# Patient Record
Sex: Female | Born: 1995 | Race: White | Hispanic: No | Marital: Married | Smoking: Former smoker
Health system: Southern US, Community
[De-identification: ages and names within clinical notes are randomized; demographics above are authoritative.]

## PROBLEM LIST (undated history)

## (undated) DIAGNOSIS — O09299 Supervision of pregnancy with other poor reproductive or obstetric history, unspecified trimester: Secondary | ICD-10-CM

## (undated) DIAGNOSIS — F32A Depression, unspecified: Secondary | ICD-10-CM

## (undated) HISTORY — PX: RHINOPLASTY: SUR1284

## (undated) HISTORY — DX: Depression, unspecified: F32.A

## (undated) HISTORY — PX: PLACEMENT OF BREAST IMPLANTS: SHX6334

## (undated) HISTORY — DX: Supervision of pregnancy with other poor reproductive or obstetric history, unspecified trimester: O09.299

---

## 2016-06-30 ENCOUNTER — Encounter (HOSPITAL_COMMUNITY): Payer: Self-pay | Admitting: Emergency Medicine

## 2016-06-30 ENCOUNTER — Emergency Department (HOSPITAL_COMMUNITY)
Admission: EM | Admit: 2016-06-30 | Discharge: 2016-06-30 | Disposition: A | Discharge status: Home / Self Care | Payer: Medicaid Other | Attending: Emergency Medicine | Admitting: Emergency Medicine

## 2016-06-30 ENCOUNTER — Emergency Department (HOSPITAL_COMMUNITY): Payer: Medicaid Other

## 2016-06-30 DIAGNOSIS — Z87891 Personal history of nicotine dependence: Secondary | ICD-10-CM | POA: Diagnosis not present

## 2016-06-30 DIAGNOSIS — R0602 Shortness of breath: Secondary | ICD-10-CM | POA: Diagnosis present

## 2016-06-30 DIAGNOSIS — J181 Lobar pneumonia, unspecified organism: Secondary | ICD-10-CM | POA: Diagnosis not present

## 2016-06-30 DIAGNOSIS — J189 Pneumonia, unspecified organism: Secondary | ICD-10-CM

## 2016-06-30 MED ORDER — LEVOFLOXACIN 500 MG PO TABS: 500.0000 mg | ORAL_TABLET | Freq: Every day | ORAL | 0 refills | Status: DC

## 2016-06-30 NOTE — ED Provider Notes (Signed)
[facility] DEPT Provider Note   CSN: [id number] Arrival date & time: 06/30/16  1350     History   Chief Complaint Chief Complaint  Patient presents with  . Shortness of Breath    HPI Allison Ellis is a 20 y.o. female.  She presents for evaluation of persistent cough for 3 weeks associated with fevers, sputum production and nausea. She denies vomiting, diarrhea, dysuria, or constipation. She went to an urgent care clinic today, was sent here for evaluation of possible "blood clot". She is on oral contraceptives. No chronic respiratory illnesses. She is an ex-smoker.There are no other known modifying factors.  HPI  History reviewed. No pertinent past medical history.  There are no active problems to display for this patient.   History reviewed. No pertinent surgical history.  OB History    Gravida Para Term Preterm AB Living   1         1   SAB TAB Ectopic Multiple Live Births                   Home Medications    Prior to Admission medications   Medication Sig Start Date End Date Taking? Authorizing Provider  levofloxacin (LEVAQUIN) 500 MG tablet Take 1 tablet (500 mg total) by mouth daily. 06/30/16   Mancel BaleElliott Kalman Nylen, MD    Family History History reviewed. No pertinent family history.  Social History Social History  Substance Use Topics  . Smoking status: Former Smoker    Quit date: 04/26/2016  . Smokeless tobacco: Never Used  . Alcohol use Yes     Comment: occassional     Allergies   Amoxicillin; Bactrim [sulfamethoxazole-trimethoprim]; Clindamycin/lincomycin; and Penicillins   Review of Systems Review of Systems  All other systems reviewed and are negative.    Physical Exam Updated Vital Signs BP 101/59   Pulse 81   Temp 97.7 F (36.5 C) (Oral)   Resp 18   Ht 5\' 6"  (1.676 m)   Wt 126 lb (57.2 kg)   LMP 06/09/2016   SpO2 96%   BMI 20.34 kg/m   Physical Exam  Constitutional: She is oriented to person, place, and time. She appears  well-developed and well-nourished. No distress.  HENT:  Head: Normocephalic and atraumatic.  Eyes: Conjunctivae and EOM are normal. Pupils are equal, round, and reactive to light.  Neck: Normal range of motion and phonation normal. Neck supple.  Cardiovascular: Normal rate and regular rhythm.   Pulmonary/Chest: Effort normal. She exhibits no tenderness.  Few scattered rhonchi, left greater than right. No increased work of breathing.  Abdominal: Soft. She exhibits no distension. There is no tenderness. There is no guarding.  Musculoskeletal: Normal range of motion. She exhibits no edema, tenderness or deformity.  Neurological: She is alert and oriented to person, place, and time. She exhibits normal muscle tone.  Skin: Skin is warm and dry.  Psychiatric: She has a normal mood and affect. Her behavior is normal. Judgment and thought content normal.  Nursing note and vitals reviewed.    ED Treatments / Results  Labs (all labs ordered are listed, but only abnormal results are displayed) Labs Reviewed - No data to display  EKG  EKG Interpretation None       Radiology Dg Chest 2 View  Result Date: 06/30/2016 CLINICAL DATA:  Chest pain shortness of breath for 3 weeks. Weight loss. EXAM: CHEST  2 VIEW COMPARISON:  06/07/2016 FINDINGS: There is opacity at the left lung base common the left upper  lobe lingula and left lower lobe, mostly silhouetting the left hemidiaphragm. This is consistent with pneumonia if there are consistent clinical symptoms. Remainder of the lungs is clear. No pleural effusion. No pneumothorax. Cardiac silhouette is normal in size. Normal mediastinal and hilar contours. Skeletal structures are unremarkable. IMPRESSION: Left lower lobe and left upper lobe lingula consolidation consistent with pneumonia. Electronically Signed   By: Amie Portlandavid  Ormond M.D.   On: 06/30/2016 14:25    Procedures Procedures (including critical care time)  Medications Ordered in  ED Medications - No data to display   Initial Impression / Assessment and Plan / ED Course  I have reviewed the triage vital signs and the nursing notes.  Pertinent labs & imaging results that were available during my care of the patient were reviewed by me and considered in my medical decision making (see chart for details).  Clinical Course     Medications - No data to display  Patient Vitals for the past 24 hrs:  BP Temp Temp src Pulse Resp SpO2 Height Weight  06/30/16 1557 101/59 97.7 F (36.5 C) Oral 81 18 96 % - -  06/30/16 1400 116/68 98.7 F (37.1 C) Oral 92 18 100 % 5\' 6"  (1.676 m) 126 lb (57.2 kg)    4:38 PM Reevaluation with update and discussion. After initial assessment and treatment, an updated evaluation reveals , At discharge, she is comfortable and has no additional complaints. Findings discussed with patient and all questions were answered. Katiejo Gilroy L      Final Clinical Impressions(s) / ED Diagnoses   Final diagnoses:  Community acquired pneumonia of left lower lobe of lung (HCC)   Patient with clinical syndrome consistent with worsening respiratory infection, and radiologic imaging consistent with pneumonia. Doubt PE, ACS or impending vascular collapse.  Nursing Notes Reviewed/ Care Coordinated Applicable Imaging Reviewed Interpretation of Laboratory Data incorporated into ED treatment  The patient appears reasonably screened and/or stabilized for discharge and I doubt any other medical condition or other [facility] requiring further screening, evaluation, or treatment in the ED at this time prior to discharge.  Plan: Home Medications- continue; Home Treatments- rest; return here if the recommended treatment, does not improve the symptoms; Recommended follow up- PCP prn   New Prescriptions Discharge Medication List as of 06/30/2016  3:54 PM    START taking these medications   Details  levofloxacin (LEVAQUIN) 500 MG tablet Take 1 tablet (500 mg total)  by mouth daily., Starting Fri 06/30/2016, Print         Mancel BaleElliott Aayat Hajjar, MD 06/30/16 [phone]

## 2016-06-30 NOTE — Discharge Instructions (Signed)
Get plenty of rest and drink a lot of fluids.  Take Tylenol or Motrin for fever.  Use Robitussin DM for cough.

## 2016-06-30 NOTE — ED Triage Notes (Signed)
PT states productive cough with green sputum worsening x3 weeks. PT states she was seen at urgent care today and was told to come to ED. PT also states nausea with vomiting at times and weight loss of 7lbs in the past 3 weeks. PT states dizziness, generalized weakness and pain with inhalation.

## 2017-05-22 ENCOUNTER — Encounter (HOSPITAL_COMMUNITY): Payer: Self-pay | Admitting: Emergency Medicine

## 2017-05-22 ENCOUNTER — Emergency Department (HOSPITAL_COMMUNITY)
Admission: EM | Admit: 2017-05-22 | Discharge: 2017-05-22 | Disposition: A | Discharge status: Home / Self Care | Payer: Self-pay | Attending: Emergency Medicine | Admitting: Emergency Medicine

## 2017-05-22 ENCOUNTER — Emergency Department (HOSPITAL_COMMUNITY): Payer: Self-pay

## 2017-05-22 DIAGNOSIS — R079 Chest pain, unspecified: Secondary | ICD-10-CM | POA: Insufficient documentation

## 2017-05-22 DIAGNOSIS — E041 Nontoxic single thyroid nodule: Secondary | ICD-10-CM

## 2017-05-22 DIAGNOSIS — R109 Unspecified abdominal pain: Secondary | ICD-10-CM | POA: Insufficient documentation

## 2017-05-22 DIAGNOSIS — Y92410 Unspecified street and highway as the place of occurrence of the external cause: Secondary | ICD-10-CM | POA: Insufficient documentation

## 2017-05-22 DIAGNOSIS — Z87891 Personal history of nicotine dependence: Secondary | ICD-10-CM | POA: Insufficient documentation

## 2017-05-22 DIAGNOSIS — S161XXA Strain of muscle, fascia and tendon at neck level, initial encounter: Secondary | ICD-10-CM | POA: Insufficient documentation

## 2017-05-22 DIAGNOSIS — Y998 Other external cause status: Secondary | ICD-10-CM | POA: Insufficient documentation

## 2017-05-22 DIAGNOSIS — R51 Headache: Secondary | ICD-10-CM | POA: Insufficient documentation

## 2017-05-22 DIAGNOSIS — Y9389 Activity, other specified: Secondary | ICD-10-CM | POA: Insufficient documentation

## 2017-05-22 LAB — I-STAT BETA HCG BLOOD, ED (MC, WL, AP ONLY)

## 2017-05-22 MED ORDER — IOPAMIDOL (ISOVUE-300) INJECTION 61%
100.0000 mL | Freq: Once | INTRAVENOUS | Status: AC | PRN
  Administered 2017-05-22: 100 mL via INTRAVENOUS

## 2017-05-22 MED ORDER — IBUPROFEN 800 MG PO TABS: 800.0000 mg | ORAL_TABLET | Freq: Three times a day (TID) | ORAL | 0 refills | Status: DC
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
## 2017-05-22 NOTE — ED Notes (Signed)
Pt aware of need for urine sample and will inform nursing staff when able to provide one
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
## 2017-05-22 NOTE — Discharge Instructions (Signed)
Your xrays show a nodule on your thyroid No broken bones No abdominal or chest / ribs / lungs injury  Motrin for pain
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
## 2019-11-19 ENCOUNTER — Ambulatory Visit
Admission: EM | Admit: 2019-11-19 | Discharge: 2019-11-19 | Disposition: A | Discharge status: Home / Self Care | Payer: Self-pay

## 2019-11-19 ENCOUNTER — Other Ambulatory Visit: Payer: Self-pay

## 2019-11-19 ENCOUNTER — Encounter: Payer: Self-pay | Admitting: Emergency Medicine

## 2019-11-19 DIAGNOSIS — Z20822 Contact with and (suspected) exposure to covid-19: Secondary | ICD-10-CM

## 2019-11-19 DIAGNOSIS — J069 Acute upper respiratory infection, unspecified: Secondary | ICD-10-CM

## 2019-11-19 MED ORDER — BENZONATATE 100 MG PO CAPS: 100.0000 mg | ORAL_CAPSULE | Freq: Three times a day (TID) | ORAL | 0 refills | Status: DC

## 2019-11-19 MED ORDER — FLUTICASONE PROPIONATE 50 MCG/ACT NA SUSP: 2.0000 | Freq: Every day | NASAL | 0 refills | Status: DC

## 2019-11-19 MED ORDER — CETIRIZINE HCL 10 MG PO TABS: 10.0000 mg | ORAL_TABLET | Freq: Every day | ORAL | 0 refills | Status: DC

## 2019-11-19 NOTE — ED Triage Notes (Signed)
Sore/itchy throat for 2 days, cough today, and has a stuffy head.

## 2019-11-19 NOTE — ED Provider Notes (Signed)
[facility]   [id number] 11/19/19 Arrival Time: 1311   CC: COVID symptoms  SUBJECTIVE: History from: patient.  Allison Ellis is a 24 y.o. female who presents with nasal congestion, sore throat and cough x 2 days.  Sister with similar symptoms.  Tested negative for COVID.  Has NOT tried OTC medications.  Symptoms are made worse at night.  Denies previous symptoms in the past related to allergies.  Denies hx of COVID infection.   Denies fever, chills, SOB, wheezing, chest pain, nausea, vomiting, changes in bowel or bladder habits.    ROS: As per HPI.  All other pertinent ROS negative.     History reviewed. No pertinent past medical history. Past Surgical History:  Procedure Laterality Date  . RHINOPLASTY     Allergies  Allergen Reactions  . Amoxicillin   . Bactrim [Sulfamethoxazole-Trimethoprim]     Hives   . Clindamycin/Lincomycin     Hives  . Penicillins     Has patient had a PCN reaction causing immediate rash, facial/tongue/throat swelling, SOB or lightheadedness with hypotension: Unknown Has patient had a PCN reaction causing severe rash involving mucus membranes or skin necrosis: Unknown Has patient had a PCN reaction that required hospitalization: Unknown Has patient had a PCN reaction occurring within the last 10 years: Unknown If all of the above answers are "NO", then may proceed with Cephalosporin use.    No current facility-administered medications on file prior to encounter.   Current Outpatient Medications on File Prior to Encounter  Medication Sig Dispense Refill  . Multiple Vitamin (MULTIVITAMIN) tablet Take 1 tablet by mouth daily.    . [DISCONTINUED] Norgestimate-Ethinyl Estradiol Triphasic (TRI-SPRINTEC) 0.18/0.215/0.25 MG-35 MCG tablet Take 1 tablet by mouth daily.     Social History   Socioeconomic History  . Marital status: Single    Spouse name: Not on file  . Number of children: Not on file  . Years of education: Not on file  . Highest  education level: Not on file  Occupational History  . Not on file  Tobacco Use  . Smoking status: Former Smoker    Quit date: 04/26/2016    Years since quitting: 3.5  . Smokeless tobacco: Never Used  Substance and Sexual Activity  . Alcohol use: Yes    Comment: occassional  . Drug use: No  . Sexual activity: Yes    Birth control/protection: Pill  Other Topics Concern  . Not on file  Social History Narrative  . Not on file   Social Determinants of Health   Financial Resource Strain:   . Difficulty of Paying Living Expenses:   Food Insecurity:   . Worried About Programme researcher, broadcasting/film/video in the Last Year:   . Barista in the Last Year:   Transportation Needs:   . Freight forwarder (Medical):   Marland Kitchen Lack of Transportation (Non-Medical):   Physical Activity:   . Days of Exercise per Week:   . Minutes of Exercise per Session:   Stress:   . Feeling of Stress :   Social Connections:   . Frequency of Communication with Friends and Family:   . Frequency of Social Gatherings with Friends and Family:   . Attends Religious Services:   . Active Member of Clubs or Organizations:   . Attends Banker Meetings:   Marland Kitchen Marital Status:   Intimate Partner Violence:   . Fear of Current or Ex-Partner:   . Emotionally Abused:   Marland Kitchen Physically Abused:   .  Sexually Abused:    Family History  Problem Relation Age of Onset  . Diabetes Mother     OBJECTIVE:  Vitals:   11/19/19 1328  BP: 117/75  Pulse: 97  Resp: 20  Temp: 98.6 F (37 C)  SpO2: 98%    General appearance: alert; appears fatigued, but nontoxic; speaking in full sentences and tolerating own secretions HEENT: NCAT; Ears: EACs clear, TMs pearly gray; Eyes: PERRL.  EOM grossly intact.  Nose: nares patent with clear rhinorrhea, Throat: oropharynx clear, tonsils non erythematous or enlarged, uvula midline  Neck: supple without LAD Lungs: unlabored respirations, symmetrical air entry; cough: absent; no respiratory  distress; CTAB Heart: regular rate and rhythm.   Skin: warm and dry Psychological: alert and cooperative; normal mood and affect  ASSESSMENT & PLAN:  1. Suspected COVID-19 virus infection   2. Viral URI with cough     Meds ordered this encounter  Medications  . cetirizine (ZYRTEC) 10 MG tablet    Sig: Take 1 tablet (10 mg total) by mouth daily.    Dispense:  30 tablet    Refill:  0    Order Specific Question:   Supervising Provider    Answer:   Raylene Everts [[id number]]  . fluticasone (FLONASE) 50 MCG/ACT nasal spray    Sig: Place 2 sprays into both nostrils daily.    Dispense:  16 g    Refill:  0    Order Specific Question:   Supervising Provider    Answer:   Raylene Everts [[id number]]  . benzonatate (TESSALON) 100 MG capsule    Sig: Take 1 capsule (100 mg total) by mouth every 8 (eight) hours.    Dispense:  21 capsule    Refill:  0    Order Specific Question:   Supervising Provider    Answer:   Raylene Everts [[id number]]   COVID testing ordered.  It will take between 2-5 days for test results.  Someone will contact you regarding abnormal results.    In the meantime: You should remain isolated in your home for 10 days from symptom onset AND greater than 72 hours after symptoms resolution (absence of fever without the use of fever-reducing medication and improvement in respiratory symptoms), whichever is longer Get plenty of rest and push fluids Tessalon Perles prescribed for cough Use OTC zyrtec for nasal congestion, runny nose, and/or sore throat Use OTC flonase for nasal congestion and runny nose Use medications daily for symptom relief Use OTC medications like ibuprofen or tylenol as needed fever or pain Call or go to the ED if you have any new or worsening symptoms such as fever, worsening cough, shortness of breath, chest tightness, chest pain, turning blue, changes in mental status, etc...   Reviewed expectations re: course of current medical issues.  Questions answered. Outlined signs and symptoms indicating need for more acute intervention. Patient verbalized understanding. After Visit Summary given.         Lestine Box, PA-C 11/19/19 1348

## 2019-11-19 NOTE — Discharge Instructions (Signed)

## 2019-11-20 LAB — NOVEL CORONAVIRUS, NAA: SARS-CoV-2, NAA: NOT DETECTED

## 2019-11-20 LAB — SARS-COV-2, NAA 2 DAY TAT

## 2020-07-07 ENCOUNTER — Other Ambulatory Visit: Payer: Self-pay

## 2020-07-07 ENCOUNTER — Ambulatory Visit
Admission: EM | Admit: 2020-07-07 | Discharge: 2020-07-07 | Disposition: A | Discharge status: Home / Self Care | Payer: Medicaid Other | Attending: Emergency Medicine | Admitting: Emergency Medicine

## 2020-07-07 DIAGNOSIS — J029 Acute pharyngitis, unspecified: Secondary | ICD-10-CM | POA: Insufficient documentation

## 2020-07-07 LAB — POCT RAPID STREP A (OFFICE): Rapid Strep A Screen: NEGATIVE

## 2020-07-07 NOTE — ED Provider Notes (Signed)
[facility]   [id number] 07/07/20 Arrival Time: 1049  YQ:IHKV THROAT  SUBJECTIVE: History from: patient.  Allison Ellis is a 24 y.o. female who presented to the urgent care with a complaint of sore throat that started yesterday.  Denies sick exposure to strep, flu or mono, or precipitating event.  Has tried OTC medication without relief.  Symptoms are made worse with swallowing, but tolerating liquids and own secretions without difficulty.  Report previous symptoms in the past.   Denies fever, chills, fatigue, ear pain, sinus pain, rhinorrhea, nasal congestion, cough, SOB, wheezing, chest pain, nausea, rash, changes in bowel or bladder habits.      ROS: As per HPI.  All other pertinent ROS negative.      History reviewed. No pertinent past medical history. Past Surgical History:  Procedure Laterality Date  . RHINOPLASTY     Allergies  Allergen Reactions  . Amoxicillin   . Bactrim [Sulfamethoxazole-Trimethoprim]     Hives   . Clindamycin/Lincomycin     Hives  . Penicillins     Has patient had a PCN reaction causing immediate rash, facial/tongue/throat swelling, SOB or lightheadedness with hypotension: Unknown Has patient had a PCN reaction causing severe rash involving mucus membranes or skin necrosis: Unknown Has patient had a PCN reaction that required hospitalization: Unknown Has patient had a PCN reaction occurring within the last 10 years: Unknown If all of the above answers are "NO", then may proceed with Cephalosporin use.    No current facility-administered medications on file prior to encounter.   Current Outpatient Medications on File Prior to Encounter  Medication Sig Dispense Refill  . benzonatate (TESSALON) 100 MG capsule Take 1 capsule (100 mg total) by mouth every 8 (eight) hours. 21 capsule 0  . cetirizine (ZYRTEC) 10 MG tablet Take 1 tablet (10 mg total) by mouth daily. 30 tablet 0  . fluticasone (FLONASE) 50 MCG/ACT nasal spray Place 2 sprays into  both nostrils daily. 16 g 0  . Multiple Vitamin (MULTIVITAMIN) tablet Take 1 tablet by mouth daily.    . [DISCONTINUED] Norgestimate-Ethinyl Estradiol Triphasic (TRI-SPRINTEC) 0.18/0.215/0.25 MG-35 MCG tablet Take 1 tablet by mouth daily.     Social History   Socioeconomic History  . Marital status: Single    Spouse name: Not on file  . Number of children: Not on file  . Years of education: Not on file  . Highest education level: Not on file  Occupational History  . Not on file  Tobacco Use  . Smoking status: Former Smoker    Quit date: 04/26/2016    Years since quitting: 4.2  . Smokeless tobacco: Never Used  Substance and Sexual Activity  . Alcohol use: Yes    Comment: occassional  . Drug use: No  . Sexual activity: Yes    Birth control/protection: Pill  Other Topics Concern  . Not on file  Social History Narrative  . Not on file   Social Determinants of Health   Financial Resource Strain:   . Difficulty of Paying Living Expenses: Not on file  Food Insecurity:   . Worried About Programme researcher, broadcasting/film/video in the Last Year: Not on file  . Ran Out of Food in the Last Year: Not on file  Transportation Needs:   . Lack of Transportation (Medical): Not on file  . Lack of Transportation (Non-Medical): Not on file  Physical Activity:   . Days of Exercise per Week: Not on file  . Minutes of Exercise per Session: Not  on file  Stress:   . Feeling of Stress : Not on file  Social Connections:   . Frequency of Communication with Friends and Family: Not on file  . Frequency of Social Gatherings with Friends and Family: Not on file  . Attends Religious Services: Not on file  . Active Member of Clubs or Organizations: Not on file  . Attends Banker Meetings: Not on file  . Marital Status: Not on file  Intimate Partner Violence:   . Fear of Current or Ex-Partner: Not on file  . Emotionally Abused: Not on file  . Physically Abused: Not on file  . Sexually Abused: Not on file    Family History  Problem Relation Age of Onset  . Diabetes Mother     OBJECTIVE:  Vitals:   07/07/20 1102  BP: 112/76  Pulse: 83  Resp: 20  Temp: 98.3 F (36.8 C)  SpO2: 97%     General appearance: alert; appears fatigued, but nontoxic, speaking in full sentences and managing own secretions HEENT: NCAT; Ears: EACs clear, TMs pearly gray with visible cone of light, without erythema; Eyes: PERRL, EOMI grossly; Nose: no obvious rhinorrhea; Throat: oropharynx clear, tonsils 1+ and mildly erythematous without white tonsillar exudates, uvula midline Neck: supple without LAD Lungs: CTA bilaterally without adventitious breath sounds; cough absent Heart: regular rate and rhythm.  Radial pulses 2+ symmetrical bilaterally Skin: warm and dry Psychological: alert and cooperative; normal mood and affect  LABS: Results for orders placed or performed during the hospital encounter of 07/07/20 (from the past 24 hour(s))  POCT rapid strep A     Status: None   Collection Time: 07/07/20 11:11 AM  Result Value Ref Range   Rapid Strep A Screen Negative Negative     ASSESSMENT & PLAN:  1. Sore throat     No orders of the defined types were placed in this encounter.  Patient is stable at discharge.  She declined lidocaine mouthwash prescription.  Discharge Instructions  Strep test negative, will send out for culture and we will call you with abnormal results Declines test for mono at this time Get plenty of rest and push fluids Use OTC throat lozenges such as Halls, Cepacol or Vicks to soothe throat Drink warm or cool liquids, use throat lozenges, or popsicles to help alleviate symptoms Gargle with salty warm water Take OTC ibuprofen or tylenol as needed for pain Follow up with PCP if symptoms persists Return or go to ER if patient has any new or worsening symptoms such as fever, chills, nausea, vomiting, worsening sore throat, cough, abdominal pain, chest pain, changes in bowel or  bladder habits, etc...  Reviewed expectations re: course of current medical issues. Questions answered. Outlined signs and symptoms indicating need for more acute intervention. Patient verbalized understanding. After Visit Summary given.        Durward Parcel, FNP 07/07/20 1131

## 2020-07-07 NOTE — Discharge Instructions (Addendum)
Strep test negative, will send out for culture and we will call you with abnormal results Declines test for mono at this time Get plenty of rest and push fluids Use OTC throat lozenges such as Halls, Cepacol or Vicks to soothe throat Drink warm or cool liquids, use throat lozenges, or popsicles to help alleviate symptoms Gargle with salty warm water Take OTC ibuprofen or tylenol as needed for pain Follow up with PCP if symptoms persists Return or go to ER if patient has any new or worsening symptoms such as fever, chills, nausea, vomiting, worsening sore throat, cough, abdominal pain, chest pain, changes in bowel or bladder habits, etc..Marland Kitchen

## 2020-07-07 NOTE — ED Triage Notes (Signed)
Pt presents with c/o sore throat that began yesterday, states throat is swollen and has h/o strep

## 2020-07-10 LAB — CULTURE, GROUP A STREP (THRC)

## 2020-11-05 DIAGNOSIS — R059 Cough, unspecified: Secondary | ICD-10-CM | POA: Diagnosis not present

## 2020-11-05 DIAGNOSIS — J029 Acute pharyngitis, unspecified: Secondary | ICD-10-CM | POA: Diagnosis not present

## 2021-02-15 ENCOUNTER — Other Ambulatory Visit: Payer: Self-pay | Admitting: Family Medicine

## 2021-02-15 ENCOUNTER — Ambulatory Visit
Admission: EM | Admit: 2021-02-15 | Discharge: 2021-02-15 | Disposition: A | Discharge status: Home / Self Care | Payer: BC Managed Care – PPO | Attending: Family Medicine | Admitting: Family Medicine

## 2021-02-15 ENCOUNTER — Other Ambulatory Visit: Payer: Self-pay

## 2021-02-15 DIAGNOSIS — R8290 Unspecified abnormal findings in urine: Secondary | ICD-10-CM | POA: Insufficient documentation

## 2021-02-15 DIAGNOSIS — N926 Irregular menstruation, unspecified: Secondary | ICD-10-CM

## 2021-02-15 LAB — POCT URINALYSIS DIP (MANUAL ENTRY)
Bilirubin, UA: NEGATIVE
Blood, UA: NEGATIVE
Glucose, UA: NEGATIVE mg/dL
Ketones, POC UA: NEGATIVE mg/dL
Nitrite, UA: NEGATIVE
Protein Ur, POC: NEGATIVE mg/dL
Spec Grav, UA: 1.025 (ref 1.010–1.025)
Urobilinogen, UA: 0.2 E.U./dL
pH, UA: 7 (ref 5.0–8.0)

## 2021-02-15 LAB — POCT URINE PREGNANCY: Preg Test, Ur: NEGATIVE

## 2021-02-15 NOTE — ED Provider Notes (Signed)
[facility]    CSN: [id number] Arrival date & time: 02/15/21  1448      History   Chief Complaint Chief Complaint  Patient presents with   Possible Pregnancy    HPI Allison Ellis is a 25 y.o. female.   HPI Patient presents today concerned for possible pregnancy. Last menstrual period was 01/21/2021.  She has taken home pregnancy test show showed faint lines however 1 was more sensitive and had a reliable positive result. She is in today for confirmatory testing.  No past medical history on file.  There are no problems to display for this patient.   Past Surgical History:  Procedure Laterality Date   RHINOPLASTY      OB History     Gravida  2   Para      Term      Preterm      AB      Living  1      SAB      IAB      Ectopic      Multiple      Live Births               Home Medications    Prior to Admission medications   Medication Sig Start Date End Date Taking? Authorizing Provider  benzonatate (TESSALON) 100 MG capsule Take 1 capsule (100 mg total) by mouth every 8 (eight) hours. 11/19/19   Wurst, [place], PA-C  cetirizine (ZYRTEC) 10 MG tablet Take 1 tablet (10 mg total) by mouth daily. 11/19/19   Wurst, [place], PA-C  fluticasone (FLONASE) 50 MCG/ACT nasal spray Place 2 sprays into both nostrils daily. 11/19/19   Wurst, [place], PA-C  Multiple Vitamin (MULTIVITAMIN) tablet Take 1 tablet by mouth daily.    [provider]  Norgestimate-Ethinyl Estradiol Triphasic (TRI-SPRINTEC) 0.18/0.215/0.25 MG-35 MCG tablet Take 1 tablet by mouth daily.  11/19/19  [provider]    Family History Family History  Problem Relation Age of Onset   Diabetes Mother     Social History Social History   Tobacco Use   Smoking status: Former    Pack years: 0.00    Types: Cigarettes    Quit date: 04/26/2016    Years since quitting: 4.8   Smokeless tobacco: Never  Substance Use Topics   Alcohol use: Yes    Comment: occassional    Drug use: No     Allergies   Amoxicillin, Bactrim [sulfamethoxazole-trimethoprim], Clindamycin/lincomycin, and Penicillins   Review of Systems Review of Systems Pertinent negatives listed in HPI   Physical Exam Triage Vital Signs ED Triage Vitals  Enc Vitals Group     BP      Pulse      Resp      Temp      Temp src      SpO2      Weight      Height      Head Circumference      Peak Flow      Pain Score      Pain Loc      Pain Edu?      Excl. in GC?    No data found.  Updated Vital Signs LMP 01/21/2021   Visual Acuity Right Eye Distance:   Left Eye Distance:   Bilateral Distance:    Right Eye Near:   Left Eye Near:    Bilateral Near:     Physical Exam General appearance: alert, well developed, well  nourished, cooperative  Head: Normocephalic, without obvious abnormality, atraumatic Respiratory: Respirations even and unlabored, normal respiratory rate Heart: Rate and rhythm normal.  Extremities: No gross deformities Skin: Skin color, texture, turgor normal. No rashes seen  Psych: Appropriate mood and affect. Neurologic: GCS 15, normal coordination, normal gait   UC Treatments / Results  Labs (all labs ordered are listed, but only abnormal results are displayed) Labs Reviewed  POCT URINALYSIS DIP (MANUAL ENTRY) - Abnormal; Notable for the following components:      Result Value   Leukocytes, UA Trace (*)    All other components within normal limits  POCT URINE PREGNANCY    EKG   Radiology No results found.  Procedures Procedures (including critical care time)  Medications Ordered in UC Medications - No data to display  Initial Impression / Assessment and Plan / UC Course  I have reviewed the triage vital signs and the nursing notes.  Pertinent labs & imaging results that were available during my care of the patient were reviewed by me and considered in my medical decision making (see chart for details).     hCG urine pregnancy  negative therefore will obtain a quantitative hCG to confirm pregnancy.  UA had a trace of leuks therefore will order a culture.  Patient will be notified of any abnormal results. Final Clinical Impressions(s) / UC Diagnoses   Final diagnoses:  Missed period   Discharge Instructions   None    ED Prescriptions   None    PDMP not reviewed this encounter.   Bing Neighbors, FNP 02/15/21 1729

## 2021-02-15 NOTE — Discharge Instructions (Addendum)
Quantitative pregnancy test can take up to 3 -5 days before results.  Someone from our clinic will contact you if your results are positive.

## 2021-02-15 NOTE — ED Triage Notes (Signed)
Pt needs confirmation pregnancy test for OB

## 2021-02-19 LAB — URINE CULTURE: Culture: 90000 — AB

## 2021-02-21 DIAGNOSIS — Z87891 Personal history of nicotine dependence: Secondary | ICD-10-CM | POA: Diagnosis not present

## 2021-02-21 DIAGNOSIS — Z3201 Encounter for pregnancy test, result positive: Secondary | ICD-10-CM | POA: Diagnosis not present

## 2021-02-25 LAB — PREGNANCY, URINE

## 2021-03-18 DIAGNOSIS — Z3201 Encounter for pregnancy test, result positive: Secondary | ICD-10-CM | POA: Diagnosis not present

## 2021-04-07 DIAGNOSIS — Z124 Encounter for screening for malignant neoplasm of cervix: Secondary | ICD-10-CM | POA: Diagnosis not present

## 2021-04-07 DIAGNOSIS — Z3A10 10 weeks gestation of pregnancy: Secondary | ICD-10-CM | POA: Diagnosis not present

## 2021-04-07 DIAGNOSIS — O09291 Supervision of pregnancy with other poor reproductive or obstetric history, first trimester: Secondary | ICD-10-CM | POA: Diagnosis not present

## 2021-04-07 DIAGNOSIS — Z3689 Encounter for other specified antenatal screening: Secondary | ICD-10-CM | POA: Diagnosis not present

## 2021-04-07 LAB — OB RESULTS CONSOLE RPR: RPR: NONREACTIVE

## 2021-04-07 LAB — OB RESULTS CONSOLE ABO/RH: RH Type: NEGATIVE

## 2021-04-07 LAB — OB RESULTS CONSOLE HIV ANTIBODY (ROUTINE TESTING): HIV: NONREACTIVE

## 2021-04-07 LAB — OB RESULTS CONSOLE RUBELLA ANTIBODY, IGM: Rubella: IMMUNE

## 2021-04-07 LAB — OB RESULTS CONSOLE ANTIBODY SCREEN: Antibody Screen: NEGATIVE

## 2021-04-07 LAB — OB RESULTS CONSOLE HEPATITIS B SURFACE ANTIGEN: Hepatitis B Surface Ag: NEGATIVE

## 2021-04-07 LAB — OB RESULTS CONSOLE GC/CHLAMYDIA
Chlamydia: NEGATIVE
Gonorrhea: NEGATIVE

## 2021-04-07 LAB — OB RESULTS CONSOLE VARICELLA ZOSTER ANTIBODY, IGG: Varicella: IMMUNE

## 2021-05-05 DIAGNOSIS — Z8759 Personal history of other complications of pregnancy, childbirth and the puerperium: Secondary | ICD-10-CM | POA: Diagnosis not present

## 2021-06-02 DIAGNOSIS — Z361 Encounter for antenatal screening for raised alphafetoprotein level: Secondary | ICD-10-CM | POA: Diagnosis not present

## 2021-06-02 DIAGNOSIS — Z3A18 18 weeks gestation of pregnancy: Secondary | ICD-10-CM | POA: Diagnosis not present

## 2021-06-02 DIAGNOSIS — O3503X0 Maternal care for (suspected) central nervous system malformation or damage in fetus, choroid plexus cysts, not applicable or unspecified: Secondary | ICD-10-CM | POA: Diagnosis not present

## 2021-06-27 ENCOUNTER — Other Ambulatory Visit: Payer: Self-pay | Admitting: Obstetrics and Gynecology

## 2021-06-27 DIAGNOSIS — Z363 Encounter for antenatal screening for malformations: Secondary | ICD-10-CM

## 2021-06-27 DIAGNOSIS — Z3689 Encounter for other specified antenatal screening: Secondary | ICD-10-CM | POA: Diagnosis not present

## 2021-06-29 ENCOUNTER — Encounter: Payer: Self-pay | Admitting: *Deleted

## 2021-07-04 ENCOUNTER — Encounter: Payer: Self-pay | Admitting: *Deleted

## 2021-07-04 ENCOUNTER — Other Ambulatory Visit: Payer: Self-pay | Admitting: Obstetrics and Gynecology

## 2021-07-04 ENCOUNTER — Other Ambulatory Visit: Payer: Self-pay | Admitting: *Deleted

## 2021-07-04 ENCOUNTER — Other Ambulatory Visit: Payer: Self-pay

## 2021-07-04 ENCOUNTER — Ambulatory Visit: Payer: BC Managed Care – PPO | Attending: Obstetrics and Gynecology | Admitting: *Deleted

## 2021-07-04 ENCOUNTER — Ambulatory Visit (HOSPITAL_BASED_OUTPATIENT_CLINIC_OR_DEPARTMENT_OTHER): Payer: BC Managed Care – PPO

## 2021-07-04 VITALS — BP 109/52 | HR 79

## 2021-07-04 DIAGNOSIS — Z3A23 23 weeks gestation of pregnancy: Secondary | ICD-10-CM

## 2021-07-04 DIAGNOSIS — O283 Abnormal ultrasonic finding on antenatal screening of mother: Secondary | ICD-10-CM | POA: Diagnosis not present

## 2021-07-04 DIAGNOSIS — Z363 Encounter for antenatal screening for malformations: Secondary | ICD-10-CM

## 2021-07-04 DIAGNOSIS — O3503X0 Maternal care for (suspected) central nervous system malformation or damage in fetus, choroid plexus cysts, not applicable or unspecified: Secondary | ICD-10-CM

## 2021-07-04 DIAGNOSIS — O09299 Supervision of pregnancy with other poor reproductive or obstetric history, unspecified trimester: Secondary | ICD-10-CM

## 2021-07-04 DIAGNOSIS — Z362 Encounter for other antenatal screening follow-up: Secondary | ICD-10-CM

## 2021-07-04 DIAGNOSIS — Z3689 Encounter for other specified antenatal screening: Secondary | ICD-10-CM

## 2021-07-04 DIAGNOSIS — O09292 Supervision of pregnancy with other poor reproductive or obstetric history, second trimester: Secondary | ICD-10-CM | POA: Diagnosis not present

## 2021-07-15 ENCOUNTER — Telehealth: Payer: Self-pay

## 2021-07-15 ENCOUNTER — Other Ambulatory Visit: Payer: BC Managed Care – PPO

## 2021-07-15 ENCOUNTER — Ambulatory Visit: Payer: BC Managed Care – PPO

## 2021-07-15 NOTE — Telephone Encounter (Signed)
Need to reschedule patient, possibly move to 12/15

## 2021-07-29 ENCOUNTER — Ambulatory Visit: Payer: BC Managed Care – PPO | Admitting: *Deleted

## 2021-07-29 ENCOUNTER — Other Ambulatory Visit: Payer: Self-pay

## 2021-07-29 ENCOUNTER — Ambulatory Visit: Payer: BC Managed Care – PPO | Attending: Obstetrics

## 2021-07-29 VITALS — BP 120/58 | HR 75

## 2021-07-29 DIAGNOSIS — O09292 Supervision of pregnancy with other poor reproductive or obstetric history, second trimester: Secondary | ICD-10-CM | POA: Insufficient documentation

## 2021-07-29 DIAGNOSIS — Z362 Encounter for other antenatal screening follow-up: Secondary | ICD-10-CM | POA: Insufficient documentation

## 2021-07-29 DIAGNOSIS — Z3A27 27 weeks gestation of pregnancy: Secondary | ICD-10-CM

## 2021-08-11 DIAGNOSIS — Z3689 Encounter for other specified antenatal screening: Secondary | ICD-10-CM | POA: Diagnosis not present

## 2021-08-14 NOTE — L&D Delivery Note (Signed)
Delivery Note:   G2P1 at [redacted]w[redacted]d  Admitting diagnosis: Encounter for induction of labor [Z34.90] Risks: gHTN Onset of labor: 10/11/2021 at 1418 IOL/Augmentation: AROM, Pitocin, and IP Foley ROM: 10/11/2021 at 0613, clear fluid  Complete dilation at 10/11/2021  1758 Onset of pushing at 1800 FHR second stage Cat II, variables with pushing, good return to baseline, moderate variability throughout  Analgesia /Anesthesia intrapartum:Epidural  Pushing in lithotomy position with CNM and L&D staff support. Husband, Clint, present for birth and supportive.  Delivery of a Live born female  Birth Weight:  pending APGAR: 20, 9  Newborn Delivery   Birth date/time: 10/11/2021 18:12:00 Delivery type: Vaginal, Spontaneous     in cephalic presentation, position OA to ROA.  APGAR:1 min-8 , 5 min-9   Nuchal Cord: Yes  x 1, loose, reduced on perineum Cord double clamped after cessation of pulsation, cut by Clint.  Collection of cord blood for typing completed. Cord blood donation-None  Arterial cord blood sample-No    Placenta delivered-Spontaneous  with 3 vessels . Uterotonics: Pitocin Placenta to L&D Uterine tone firm  Bleeding scant  None  laceration identified.  Episiotomy:None  Local analgesia: N/A  Repair: N/A Est. Blood Loss (mL): [id number] Complications: None  Mom to postpartum.  Baby Johnny to Couplet care / Skin to Skin.  Delivery Report:   Review the Delivery Report for details.    Suzan Nailer, CNM, MSN 10/11/2021, 6:31 PM

## 2021-08-24 DIAGNOSIS — Z8759 Personal history of other complications of pregnancy, childbirth and the puerperium: Secondary | ICD-10-CM | POA: Diagnosis not present

## 2021-09-07 DIAGNOSIS — Z3A32 32 weeks gestation of pregnancy: Secondary | ICD-10-CM | POA: Diagnosis not present

## 2021-09-07 DIAGNOSIS — O360130 Maternal care for anti-D [Rh] antibodies, third trimester, not applicable or unspecified: Secondary | ICD-10-CM | POA: Diagnosis not present

## 2021-09-12 DIAGNOSIS — N898 Other specified noninflammatory disorders of vagina: Secondary | ICD-10-CM | POA: Diagnosis not present

## 2021-09-21 DIAGNOSIS — O26843 Uterine size-date discrepancy, third trimester: Secondary | ICD-10-CM | POA: Diagnosis not present

## 2021-09-21 DIAGNOSIS — Z3A34 34 weeks gestation of pregnancy: Secondary | ICD-10-CM | POA: Diagnosis not present

## 2021-09-21 DIAGNOSIS — R030 Elevated blood-pressure reading, without diagnosis of hypertension: Secondary | ICD-10-CM | POA: Diagnosis not present

## 2021-09-30 DIAGNOSIS — Z3685 Encounter for antenatal screening for Streptococcus B: Secondary | ICD-10-CM | POA: Diagnosis not present

## 2021-09-30 LAB — OB RESULTS CONSOLE GBS: GBS: NEGATIVE

## 2021-10-07 ENCOUNTER — Other Ambulatory Visit: Payer: Self-pay | Admitting: Obstetrics and Gynecology

## 2021-10-07 DIAGNOSIS — Z3A37 37 weeks gestation of pregnancy: Secondary | ICD-10-CM | POA: Diagnosis not present

## 2021-10-07 DIAGNOSIS — Z8759 Personal history of other complications of pregnancy, childbirth and the puerperium: Secondary | ICD-10-CM | POA: Diagnosis not present

## 2021-10-07 DIAGNOSIS — O321XX0 Maternal care for breech presentation, not applicable or unspecified: Secondary | ICD-10-CM | POA: Diagnosis not present

## 2021-10-10 ENCOUNTER — Other Ambulatory Visit (HOSPITAL_COMMUNITY): Payer: Self-pay | Admitting: *Deleted

## 2021-10-10 ENCOUNTER — Other Ambulatory Visit: Payer: Self-pay

## 2021-10-10 ENCOUNTER — Other Ambulatory Visit: Payer: Self-pay | Admitting: Obstetrics and Gynecology

## 2021-10-10 LAB — SARS CORONAVIRUS 2 (TAT 6-24 HRS): SARS Coronavirus 2: NEGATIVE

## 2021-10-11 ENCOUNTER — Inpatient Hospital Stay (HOSPITAL_COMMUNITY): Payer: BC Managed Care – PPO

## 2021-10-11 ENCOUNTER — Encounter (HOSPITAL_COMMUNITY): Payer: Self-pay | Admitting: Obstetrics and Gynecology

## 2021-10-11 ENCOUNTER — Inpatient Hospital Stay (HOSPITAL_COMMUNITY): Payer: BC Managed Care – PPO | Admitting: Anesthesiology

## 2021-10-11 ENCOUNTER — Inpatient Hospital Stay (HOSPITAL_COMMUNITY)
Admission: AD | Admit: 2021-10-11 | Discharge: 2021-10-13 | DRG: 807 | Disposition: A | Discharge status: Home / Self Care | Payer: BC Managed Care – PPO | Attending: Obstetrics and Gynecology | Admitting: Obstetrics and Gynecology

## 2021-10-11 DIAGNOSIS — F32A Depression, unspecified: Secondary | ICD-10-CM | POA: Diagnosis present

## 2021-10-11 DIAGNOSIS — F419 Anxiety disorder, unspecified: Secondary | ICD-10-CM | POA: Diagnosis present

## 2021-10-11 DIAGNOSIS — Z3A37 37 weeks gestation of pregnancy: Secondary | ICD-10-CM | POA: Diagnosis not present

## 2021-10-11 DIAGNOSIS — O139 Gestational [pregnancy-induced] hypertension without significant proteinuria, unspecified trimester: Secondary | ICD-10-CM | POA: Diagnosis present

## 2021-10-11 DIAGNOSIS — Z91410 Personal history of adult physical and sexual abuse: Secondary | ICD-10-CM | POA: Diagnosis present

## 2021-10-11 DIAGNOSIS — O9902 Anemia complicating childbirth: Secondary | ICD-10-CM | POA: Diagnosis present

## 2021-10-11 DIAGNOSIS — Z3490 Encounter for supervision of normal pregnancy, unspecified, unspecified trimester: Secondary | ICD-10-CM | POA: Diagnosis present

## 2021-10-11 DIAGNOSIS — O134 Gestational [pregnancy-induced] hypertension without significant proteinuria, complicating childbirth: Principal | ICD-10-CM | POA: Diagnosis present

## 2021-10-11 DIAGNOSIS — Z87891 Personal history of nicotine dependence: Secondary | ICD-10-CM | POA: Diagnosis not present

## 2021-10-11 DIAGNOSIS — O26893 Other specified pregnancy related conditions, third trimester: Secondary | ICD-10-CM | POA: Diagnosis present

## 2021-10-11 DIAGNOSIS — Z6791 Unspecified blood type, Rh negative: Secondary | ICD-10-CM | POA: Diagnosis not present

## 2021-10-11 LAB — COMPREHENSIVE METABOLIC PANEL
ALT: 12 U/L (ref 0–44)
AST: 19 U/L (ref 15–41)
Albumin: 2.7 g/dL — ABNORMAL LOW (ref 3.5–5.0)
Alkaline Phosphatase: 149 U/L — ABNORMAL HIGH (ref 38–126)
Anion gap: 7 (ref 5–15)
BUN: 7 mg/dL (ref 6–20)
CO2: 26 mmol/L (ref 22–32)
Calcium: 8.4 mg/dL — ABNORMAL LOW (ref 8.9–10.3)
Chloride: 103 mmol/L (ref 98–111)
Creatinine, Ser: 0.61 mg/dL (ref 0.44–1.00)
GFR, Estimated: >60 mL/min (ref >60)
Glucose, Bld: 87 mg/dL (ref 70–99)
Potassium: 3.7 mmol/L (ref 3.5–5.1)
Sodium: 136 mmol/L (ref 135–145)
Total Bilirubin: 0.3 mg/dL (ref 0.3–1.2)
Total Protein: 6 g/dL — ABNORMAL LOW (ref 6.5–8.1)

## 2021-10-11 LAB — CBC
HCT: 32.7 % — ABNORMAL LOW (ref 36.0–46.0)
HCT: 32.9 % — ABNORMAL LOW (ref 36.0–46.0)
Hemoglobin: 10.9 g/dL — ABNORMAL LOW (ref 12.0–15.0)
Hemoglobin: 11 g/dL — ABNORMAL LOW (ref 12.0–15.0)
MCH: 29.5 pg (ref 26.0–34.0)
MCH: 29.6 pg (ref 26.0–34.0)
MCHC: 33.3 g/dL (ref 30.0–36.0)
MCHC: 33.4 g/dL (ref 30.0–36.0)
MCV: 88.4 fL (ref 80.0–100.0)
MCV: 88.7 fL (ref 80.0–100.0)
Platelets: 215 10*3/uL (ref 150–400)
Platelets: 220 10*3/uL (ref 150–400)
RBC: 3.7 MIL/uL — ABNORMAL LOW (ref 3.87–5.11)
RBC: 3.71 MIL/uL — ABNORMAL LOW (ref 3.87–5.11)
RDW: 13.7 % (ref 11.5–15.5)
RDW: 13.8 % (ref 11.5–15.5)
WBC: 10.6 10*3/uL — ABNORMAL HIGH (ref 4.0–10.5)
WBC: 10.8 10*3/uL — ABNORMAL HIGH (ref 4.0–10.5)
nRBC: 0 % (ref 0.0–0.2)
nRBC: 0 % (ref 0.0–0.2)

## 2021-10-11 LAB — TYPE AND SCREEN
ABO/RH(D): O NEG
Antibody Screen: POSITIVE

## 2021-10-11 LAB — PROTEIN / CREATININE RATIO, URINE
Creatinine, Urine: 40.62 mg/dL
Total Protein, Urine: <6 mg/dL

## 2021-10-11 LAB — RPR: RPR Ser Ql: NONREACTIVE

## 2021-10-11 MED ORDER — COCONUT OIL OIL: 1.0000 "application " | TOPICAL_OIL | Status: DC | PRN

## 2021-10-11 MED ORDER — FENTANYL-BUPIVACAINE-NACL 0.5-0.125-0.9 MG/250ML-% EP SOLN
12.0000 mL/h | EPIDURAL | Status: DC | PRN
  Administered 2021-10-11: 12 mL/h via EPIDURAL
  Filled 2021-10-11: qty 250

## 2021-10-11 MED ORDER — LACTATED RINGERS IV SOLN: 500.0000 mL | INTRAVENOUS | Status: DC | PRN

## 2021-10-11 MED ORDER — LACTATED RINGERS AMNIOINFUSION: INTRAVENOUS | Status: DC

## 2021-10-11 MED ORDER — EPHEDRINE 5 MG/ML INJ: 10.0000 mg | INTRAVENOUS | Status: DC | PRN

## 2021-10-11 MED ORDER — PRENATAL MULTIVITAMIN CH
1.0000 | ORAL_TABLET | Freq: Every day | ORAL | Status: DC
  Filled 2021-10-11: qty 1

## 2021-10-11 MED ORDER — LACTATED RINGERS IV SOLN: INTRAVENOUS | Status: DC

## 2021-10-11 MED ORDER — ONDANSETRON HCL 4 MG PO TABS: 4.0000 mg | ORAL_TABLET | ORAL | Status: DC | PRN

## 2021-10-11 MED ORDER — DIPHENHYDRAMINE HCL 25 MG PO CAPS: 25.0000 mg | ORAL_CAPSULE | Freq: Four times a day (QID) | ORAL | Status: DC | PRN

## 2021-10-11 MED ORDER — TERBUTALINE SULFATE 1 MG/ML IJ SOLN: 0.2500 mg | Freq: Once | INTRAMUSCULAR | Status: DC | PRN

## 2021-10-11 MED ORDER — TETANUS-DIPHTH-ACELL PERTUSSIS 5-2.5-18.5 LF-MCG/0.5 IM SUSY: 0.5000 mL | PREFILLED_SYRINGE | Freq: Once | INTRAMUSCULAR | Status: DC

## 2021-10-11 MED ORDER — ONDANSETRON HCL 4 MG/2ML IJ SOLN: 4.0000 mg | INTRAMUSCULAR | Status: DC | PRN

## 2021-10-11 MED ORDER — ZOLPIDEM TARTRATE 5 MG PO TABS: 5.0000 mg | ORAL_TABLET | Freq: Every evening | ORAL | Status: DC | PRN

## 2021-10-11 MED ORDER — LIDOCAINE HCL (PF) 1 % IJ SOLN
INTRAMUSCULAR | Status: DC | PRN
  Administered 2021-10-11 (×2): 4 mL via EPIDURAL

## 2021-10-11 MED ORDER — ONDANSETRON HCL 4 MG/2ML IJ SOLN: 4.0000 mg | Freq: Four times a day (QID) | INTRAMUSCULAR | Status: DC | PRN

## 2021-10-11 MED ORDER — ACETAMINOPHEN 325 MG PO TABS
650.0000 mg | ORAL_TABLET | ORAL | Status: DC | PRN
  Administered 2021-10-12: 650 mg via ORAL
  Filled 2021-10-11: qty 2

## 2021-10-11 MED ORDER — IBUPROFEN 600 MG PO TABS
600.0000 mg | ORAL_TABLET | Freq: Four times a day (QID) | ORAL | Status: DC
  Administered 2021-10-11 – 2021-10-12 (×4): 600 mg via ORAL
  Filled 2021-10-11 (×6): qty 1

## 2021-10-11 MED ORDER — OXYTOCIN-SODIUM CHLORIDE 30-0.9 UT/500ML-% IV SOLN
2.5000 [IU]/h | INTRAVENOUS | Status: DC
  Administered 2021-10-11: 2.5 [IU]/h via INTRAVENOUS
  Filled 2021-10-11: qty 500

## 2021-10-11 MED ORDER — PHENYLEPHRINE 40 MCG/ML (10ML) SYRINGE FOR IV PUSH (FOR BLOOD PRESSURE SUPPORT)
80.0000 ug | PREFILLED_SYRINGE | INTRAVENOUS | Status: DC | PRN
  Filled 2021-10-11: qty 10

## 2021-10-11 MED ORDER — DIBUCAINE (PERIANAL) 1 % EX OINT: 1.0000 "application " | TOPICAL_OINTMENT | CUTANEOUS | Status: DC | PRN

## 2021-10-11 MED ORDER — SOD CITRATE-CITRIC ACID 500-334 MG/5ML PO SOLN: 30.0000 mL | ORAL | Status: DC | PRN

## 2021-10-11 MED ORDER — ACETAMINOPHEN 325 MG PO TABS: 650.0000 mg | ORAL_TABLET | ORAL | Status: DC | PRN

## 2021-10-11 MED ORDER — LACTATED RINGERS IV SOLN
500.0000 mL | Freq: Once | INTRAVENOUS | Status: AC
  Administered 2021-10-11: 500 mL via INTRAVENOUS

## 2021-10-11 MED ORDER — OXYTOCIN BOLUS FROM INFUSION
333.0000 mL | Freq: Once | INTRAVENOUS | Status: AC
  Administered 2021-10-11: 333 mL via INTRAVENOUS

## 2021-10-11 MED ORDER — SENNOSIDES-DOCUSATE SODIUM 8.6-50 MG PO TABS
2.0000 | ORAL_TABLET | Freq: Every day | ORAL | Status: DC
  Filled 2021-10-11: qty 2

## 2021-10-11 MED ORDER — WITCH HAZEL-GLYCERIN EX PADS: 1.0000 "application " | MEDICATED_PAD | CUTANEOUS | Status: DC | PRN

## 2021-10-11 MED ORDER — OXYTOCIN-SODIUM CHLORIDE 30-0.9 UT/500ML-% IV SOLN: 1.0000 m[IU]/min | INTRAVENOUS | Status: DC

## 2021-10-11 MED ORDER — LIDOCAINE HCL (PF) 1 % IJ SOLN
30.0000 mL | INTRAMUSCULAR | Status: DC | PRN
Start: 2021-10-11 — End: 2021-10-11

## 2021-10-11 MED ORDER — SIMETHICONE 80 MG PO CHEW: 80.0000 mg | CHEWABLE_TABLET | ORAL | Status: DC | PRN

## 2021-10-11 MED ORDER — OXYTOCIN-SODIUM CHLORIDE 30-0.9 UT/500ML-% IV SOLN
1.0000 m[IU]/min | INTRAVENOUS | Status: DC
  Filled 2021-10-11: qty 500

## 2021-10-11 MED ORDER — PHENYLEPHRINE 40 MCG/ML (10ML) SYRINGE FOR IV PUSH (FOR BLOOD PRESSURE SUPPORT): 80.0000 ug | PREFILLED_SYRINGE | INTRAVENOUS | Status: DC | PRN

## 2021-10-11 MED ORDER — BENZOCAINE-MENTHOL 20-0.5 % EX AERO: 1.0000 "application " | INHALATION_SPRAY | CUTANEOUS | Status: DC | PRN

## 2021-10-11 MED ORDER — DIPHENHYDRAMINE HCL 50 MG/ML IJ SOLN: 12.5000 mg | INTRAMUSCULAR | Status: DC | PRN

## 2021-10-11 MED ORDER — OXYTOCIN-SODIUM CHLORIDE 30-0.9 UT/500ML-% IV SOLN
1.0000 m[IU]/min | INTRAVENOUS | Status: DC
  Administered 2021-10-11: 2 m[IU]/min via INTRAVENOUS

## 2021-10-11 NOTE — Anesthesia Procedure Notes (Signed)
Epidural Patient location during procedure: OB Start time: 10/11/2021 6:45 AM End time: 10/11/2021 6:48 AM  Staffing Anesthesiologist: Kaylyn Layer, MD Performed: anesthesiologist   Preanesthetic Checklist Completed: patient identified, IV checked, risks and benefits discussed, monitors and equipment checked, pre-op evaluation and timeout performed  Epidural Patient position: sitting Prep: DuraPrep and site prepped and draped Patient monitoring: continuous pulse ox, blood pressure and heart rate Approach: midline Location: L3-L4 Injection technique: LOR air  Needle:  Needle type: Tuohy  Needle gauge: 17 G Needle length: 9 cm Needle insertion depth: 5 cm Catheter type: closed end flexible Catheter size: 19 Gauge Catheter at skin depth: 10 cm Test dose: negative and Other (1% lidocaine)  Assessment Events: blood not aspirated, injection not painful, no injection resistance, no paresthesia and negative IV test  Additional Notes Patient identified. Risks, benefits, and alternatives discussed with patient including but not limited to bleeding, infection, nerve damage, paralysis, failed block, incomplete pain control, headache, blood pressure changes, nausea, vomiting, reactions to medication, itching, and postpartum back pain. Confirmed with bedside nurse the patient's most recent platelet count. Confirmed with patient that they are not currently taking any anticoagulation, have any bleeding history, or any family history of bleeding disorders. Patient expressed understanding and wished to proceed. All questions were answered. Sterile technique was used throughout the entire procedure. Please see nursing notes for vital signs.   Crisp LOR on first pass. Test dose was given through epidural catheter and negative prior to continuing to dose epidural or start infusion. Warning signs of high block given to the patient including shortness of breath, tingling/numbness in hands, complete  motor block, or any concerning symptoms with instructions to call for help. Patient was given instructions on fall risk and not to get out of bed. All questions and concerns addressed with instructions to call with any issues or inadequate analgesia.  Reason for block:procedure for pain

## 2021-10-11 NOTE — Anesthesia Preprocedure Evaluation (Signed)
Anesthesia Evaluation  Patient identified by MRN, date of birth, ID band Patient awake    Reviewed: Allergy & Precautions, Patient's Chart, lab work & pertinent test results  History of Anesthesia Complications Negative for: history of anesthetic complications  Airway Mallampati: II  TM Distance: >3 FB Neck ROM: Full    Dental no notable dental hx.    Pulmonary former smoker,    Pulmonary exam normal        Cardiovascular negative cardio ROS Normal cardiovascular exam     Neuro/Psych negative neurological ROS  negative psych ROS   GI/Hepatic negative GI ROS, Neg liver ROS,   Endo/Other  negative endocrine ROS  Renal/GU negative Renal ROS  negative genitourinary   Musculoskeletal negative musculoskeletal ROS (+)   Abdominal   Peds  Hematology negative hematology ROS (+)   Anesthesia Other Findings Day of surgery medications reviewed with patient.  Reproductive/Obstetrics (+) Pregnancy (gHTN)                             Anesthesia Physical Anesthesia Plan  ASA: 2  Anesthesia Plan: Epidural   Post-op Pain Management:    Induction:   PONV Risk Score and Plan: Treatment may vary due to age or medical condition  Airway Management Planned: Natural Airway  Additional Equipment: Fetal Monitoring  Intra-op Plan:   Post-operative Plan:   Informed Consent: I have reviewed the patients History and Physical, chart, labs and discussed the procedure including the risks, benefits and alternatives for the proposed anesthesia with the patient or authorized representative who has indicated his/her understanding and acceptance.       Plan Discussed with:   Anesthesia Plan Comments:         Anesthesia Quick Evaluation

## 2021-10-11 NOTE — Progress Notes (Signed)
S: Feeling some contractions. Husband at the bedside. Discussed the R/B/A of AROM for labor induction and patient consents to procedure.   O: Vitals:   10/11/21 0312 10/11/21 0402 10/11/21 0506 10/11/21 0548  BP: 124/72 113/68 126/75 122/81  Pulse: 90 78 70 74  Resp: 18 18 16    Temp: 97.8 F (36.6 C)  97.7 F (36.5 C)   TempSrc: Oral  Oral   Weight:      Height:       FHT:  FHR: 130 bpm, variability: moderate,  accelerations:  Present,  decelerations:  Absent UC:   regular, every 2-3.5 minutes SVE:   Dilation: 3.5 Effacement (%): 50 Station: -2 Exam by:: Estill Bamberg, CNM  AROM of a large amount of clear fluid at [phone].   A / P: Induction of labor due to gestational hypertension, s/p Foley balloon, progressing well on pitocin  Fetal Wellbeing:  Category I PEC: BP WNL, no signs or symptoms of toxicity Pain Control:  Labor support without medications Anticipated MOD:  NSVD  Continue to increase Pitocin 2x2 until active labor. May have epidural upon request. Allow for passive descent to decrease trauma during the pushing phase of labor.   Suzan Nailer, CNM, MSN 10/11/2021, 6:19 AM
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
## 2021-10-11 NOTE — Progress Notes (Signed)
S: Comfortable with epidural. Husband, Clint, present and supportive.   O: Vitals:   10/11/21 0930 10/11/21 1000 10/11/21 1030 10/11/21 1100  BP: (!) 113/55 120/68 114/72 111/67  Pulse: 71 66 73 77  Resp: 16 18 18 18   Temp:  97.6 F (36.4 C)    TempSrc:  Oral    SpO2: 99% 99% 100% 100%  Weight:      Height:       FHT:  FHR: 115 bpm, variability: moderate,  accelerations:  Present,  decelerations:  Absent UC:   regular, every 2-4 minutes SVE:   Dilation: 5 Effacement (%): 50 Station: -2 Exam by: A. Finneas Mathe CNM  IUPC placed without difficulty.   A / P: Induction of labor due to gestational hypertension, progressing well on pitocin  Fetal Wellbeing:  Category I PEC: BPs WNL, no signs or symptoms of toxicity Pain Control:  Epidural Anticipated MOD:  NSVD  Continue to titrate Pitocin 2x2 until adequate MVUs. Recheck SVE in 4 hours or sooner if necessary.   Dr. updated on patient status and plan of care.   Billy Coast, CNM, MSN 10/11/2021, 11:55 AM

## 2021-10-12 LAB — CBC
HCT: 29.2 % — ABNORMAL LOW (ref 36.0–46.0)
Hemoglobin: 10 g/dL — ABNORMAL LOW (ref 12.0–15.0)
MCH: 30.2 pg (ref 26.0–34.0)
MCHC: 34.2 g/dL (ref 30.0–36.0)
MCV: 88.2 fL (ref 80.0–100.0)
Platelets: 200 10*3/uL (ref 150–400)
RBC: 3.31 MIL/uL — ABNORMAL LOW (ref 3.87–5.11)
RDW: 13.8 % (ref 11.5–15.5)
WBC: 15 10*3/uL — ABNORMAL HIGH (ref 4.0–10.5)
nRBC: 0 % (ref 0.0–0.2)
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
## 2021-10-12 NOTE — Lactation Note (Signed)
This note was copied from a baby's chart. ?Lactation Consultation Note ?Mother confirmed with [place] that she plans to formula feed only. She is aware of [place] services while infant is in NICU prn. [place] services are complete. No charge. ? ?Patient Name: Allison Ellis ?Today's Date: 10/12/2021 ?  ?Age:27 hours ? ? ?[name] ?10/12/2021, 2:01 PM ? ? ? ?

## 2021-10-12 NOTE — Clinical Social Work Maternal (Signed)
?CLINICAL SOCIAL WORK MATERNAL/CHILD NOTE ? ?Patient Details  ?Name: Allison Ellis ?MRN: [id number] ?Date of Birth: 11/06/1995 ? ?Date:  10/12/2021 ? ?Clinical Social Worker Initiating Note:  Shalah Estelle, LCSW Date/Time: Initiated:  10/12/21/1235    ? ?Child's Name:  Allison Ellis  ? ?Biological Parents:  Mother, Father (Father: Allison Ellis)  ? ?Need for Interpreter:  None  ? ?Reason for Referral:  Behavioral Health Concerns, Other (Comment) (Infant's NICU Admission)  ? ?Address:  [address] ?[place]  [address]  ?  ?Phone number:  [phone] (home)    ? ?Additional phone number:  ? ?Household Members/Support Persons (HM/SP):   Household Member/Support Person 1, Household Member/Support Person 2, Household Member/Support Person 3 ? ? ?HM/SP Name Relationship DOB or Age  ?HM/SP -1 Allison Fiddler Husband/FOB    ?HM/SP -2 Allison Ellis Stepdaughter 12 years old  ?HM/SP -3 Allison Ellis son 9 years old  ?HM/SP -4        ?HM/SP -5        ?HM/SP -6        ?HM/SP -7        ?HM/SP -8        ? ? ?Natural Supports (not living in the home):     ? ?Professional Supports: None  ? ?Employment: Unemployed  ? ?Type of Work:    ? ?Education:  Some College  ? ?Homebound arranged:   ? ?Financial Resources:  Private Insurance   ? ?Other Resources:     ? ?Cultural/Religious Considerations Which May Impact Care:   ? ?Strengths:  Ability to meet basic needs  , Home prepared for child  , Pediatrician chosen  ? ?Psychotropic Medications:        ? ?Pediatrician:    [place] area ? ?Pediatrician List:  ? ?[facility]  ?[place]    ?[place]    ?[place]    ?[place]    ?[place]    ? ? ?Pediatrician Fax Number:   ? ?Risk Factors/Current Problems:  Mental Health Concerns    ? ?Cognitive State:  Alert  , Able to Concentrate  , Insightful  , Goal Oriented  , Linear Thinking    ? ?Mood/Affect:  Calm  , Interested  , Comfortable  , Sad  , Tearful    ? ?CSW Assessment: CSW met with MOB  at bedside to complete psychosocial assessment. CSW introduced self and explained role. MOB was welcoming, pleasant, open, and remained engaged during assessment. MOB reported that she resides with Husband/FOB and two older children. MOB reported that they have all items needed to care for infant including a car seat, basinet, and crib. CSW inquired about MOB' support system, MOB reported that her husband is a support.  ? ?CSW inquired about MOB's mental health history. MOB endorsed a history of depression dating back to 26 years old. MOB reported that she was on medication and participating in therapy prior to pregnancy for 3 months. MOB shared that it wasn't helpful, noting she didn't like the way the medication made her feel explaining that she felt like a zombie. MOB reported that she missed an appointment and they stopped her medication cold turkey, which made her feel worse than when she began medication. MOB reported that she was on Celexa. MOB reported that her depression was a lot better during her pregnancy. MOB shared that she had a 3 week spurt where she experienced a really hard depression. MOB was unable to recall   any triggers for her depression and shared that she just wakes up feeling grouchy or down. CSW inquired about MOB's coping skills, MOB reported that getting out the house is helpful. MOB shared that her husband will force her to get out the house when needed. CSW positively affirmed MOB's support from husband. CSW and MOB discussed getting sunlight as it can also be helpful. CSW asked if MOB felt like therapy resources would be helpful, MOB reported no and shared that therapy can be an inconvenience with all of her other responsibilities. CSW acknowledged MOB's role and responsibilities and encouraged MOB to consider thinking about things that  can be put in place that will aide in increasing her mood and decreasing depressive symptoms. CSW spoke about the importance of MOB caring for  herself. CSW and MOB discussed being proactive with medication and discussing different medication options with provider. MOB shared that she is scared to restart medication, CSW acknowledged and normalized MOB's fears given her previous experience.  ? ?MOB reported that she spoke with a Nurse Midwife regarding potentially starting medication again. CSW positively affirmed MOB being proactive in regards to discussing medication. CSW encouraged MOB to continue discussing medication and getting a prescription if she is interested so she can have it available if she decides to take medication. MOB endorsed a history of postpartum depression with her older son and shared that she didn't want to experience that again. MOB described her postpartum depression as just laying on the couch. CSW acknowledged that MOB does not want to go through this again and encouraged MOB to consider medication if she thinks that it will be helpful in preventing/reducing the chance of her experiencing postpartum depression.  ? ?CSW inquired about how MOB was feeling emotionally since giving birth, MOB reported that she has been upset due to infant's NICU admission. CSW acknowledged, normalized, and validated MOB's feelings. CSW and MOB discussed emotions associated with a NICU admission.MOB shared that her older son was also in the NICU, MOB became tearful explaining this. CSW acknowledged and validated MOB's feelings. CSW and MOB discussed how hard it is to not have the experience you planned for and also the difficulties associated with MOB being discharged before infant. MOB presented calm and possessed great insight about her mental health history. MOB did not demonstrate any acute mental health signs/symptoms. CSW assessed for safety, MOB denied SI, HI, and domestic violence.  ? ?CSW provided education regarding the baby blues period vs. perinatal mood disorders, discussed treatment and gave resources for mental health follow up if  concerns arise.  CSW recommends self-evaluation during the postpartum time period using the New Mom Checklist from Postpartum Progress and encouraged MOB to contact a medical professional if symptoms are noted at any time.   ? ?CSW provided review of Sudden Infant Death Syndrome (SIDS) precautions.  ? ?CSW and MOB discussed infant's NICU admission. CSW informed MOB about the NICU, what to expect, and resources/supports available while infant is admitted to the NICU. MOB reported that she does and doesn't feel informed about infant's care as she spoke about what it will take for infant to be ready for discharge. CSW and MOB discussed the difficulties with the uncertainty of when infant will be ready for discharge. MOB denied any transportation barriers with visiting infant in the NICU. MOB denied any questions regarding the NICU.  ?  ?CSW will continue to offer resources/supports while infant is admitted to the NICU.  ? ? ?CSW Plan/Description:  Sudden Infant   Death Syndrome (SIDS) Education, Perinatal Mood and Anxiety Disorder (PMADs) Education, Other Patient/Family Education, Psychosocial Support and Ongoing Assessment of Needs  ? ? ?Dylann Layne L Magda Muise, LCSW ?10/12/2021, 12:38 PM ?

## 2021-10-12 NOTE — Anesthesia Postprocedure Evaluation (Signed)
Anesthesia Post Note ? ?Patient: Ciniyah Tella ? ?Procedure(s) Performed: AN AD HOC LABOR EPIDURAL ? ?  ? ?Patient location during evaluation: Mother Baby ?Anesthesia Type: Epidural ?Level of consciousness: awake and alert ?Pain management: pain level controlled ?Vital Signs Assessment: post-procedure vital signs reviewed and stable ?Respiratory status: spontaneous breathing, nonlabored ventilation and respiratory function stable ?Cardiovascular status: stable ?Postop Assessment: no headache, no backache and epidural receding ?Anesthetic complications: no ? ? ?No notable events documented. ? ?Last Vitals:  ?Vitals:  ? 10/12/21 0130 10/12/21 0515  ?BP: 127/61 114/64  ?Pulse: 72 76  ?Resp: 17 17  ?Temp: 37.1 ?C 36.8 ?C  ?SpO2: 97% 98%  ?  ?Last Pain:  ?Vitals:  ? 10/12/21 0723  ?TempSrc:   ?PainSc: 0-No pain  ? ?Pain Goal:   ? ?  ?  ?  ?  ?  ?  ?  ? ?Beatrice Sehgal N ? ? ? ? ?

## 2021-10-13 MED ORDER — ACETAMINOPHEN 500 MG PO TABS: 1000.0000 mg | ORAL_TABLET | Freq: Four times a day (QID) | ORAL | 2 refills | Status: DC | PRN

## 2021-10-13 MED ORDER — IBUPROFEN 600 MG PO TABS: 600.0000 mg | ORAL_TABLET | Freq: Four times a day (QID) | ORAL | 0 refills | Status: DC

## 2021-10-13 MED ORDER — BUPROPION HCL ER (XL) 150 MG PO TB24: 150.0000 mg | ORAL_TABLET | ORAL | 2 refills | Status: DC

## 2021-10-13 MED ORDER — BENZOCAINE-MENTHOL 20-0.5 % EX AERO: 1.0000 "application " | INHALATION_SPRAY | CUTANEOUS | Status: DC | PRN
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
## 2021-10-13 NOTE — Progress Notes (Signed)
CSW met with MOB at infant's bedside to discuss edinburgh score 17 and answering "sometimes" to question 10 (the thought of harming myself has occurred to me), FOB present. MOB granted CSW verbal permission to speak in front of FOB about anything. CSW and MOB discussed edinburgh score 17. MOB reported that she had some thoughts of self harm in the past but not in the past week. MOB denied any current thoughts of SI. CSW inquired about MOB being prescribed medication prior to discharge to try and reduce MOB's chances of experiencing postpartum depression again, FOB reported that MOB was prescribed medication and they plan to pick it up on the way home. CSW asked how was MOB feeling about taking medication, MOB reported that she felt nervous. CSW acknowledged and validated MOB's feelings. CSW praised MOB for being proactive and getting a prescription to try and reduce the chances of experiencing postpartum depression again. CSW inquired about how MOB was feeling emotionally today, MOB reported that she was feeling okay especially since she got to see infant. CSW inquired about any current needs, MOB reported none. CSW encouraged MOB to contact CSW if any needs arise. MOB thanked CSW for visit.  ? ?CSW will continue to offer resources/supports while infant is admitted to the NICU.  ? ?Allison Machuca, LCSW ?Clinical Social Worker ?[facility] ?Cell#: [phone] ?

## 2021-10-19 DIAGNOSIS — Z412 Encounter for routine and ritual male circumcision: Secondary | ICD-10-CM | POA: Diagnosis not present

## 2021-10-21 ENCOUNTER — Telehealth (HOSPITAL_COMMUNITY): Payer: Self-pay | Admitting: *Deleted

## 2021-10-21 NOTE — Telephone Encounter (Addendum)
Mom reports feeling good. No concerns about herself at this time. EPDS not completed now because pt is out shopping. Asked nurse to return call in a few hours for EPDS. [facility] score=17) Has not used Wellbutrin because she's feeling so much better and baby came home from NICU yesterday. ?Mom reports baby is doing well. Feeding, peeing, and pooping without difficulty. Safe sleep reviewed. Mom reports no concerns about baby at present. ? ?Duffy Rhody, RN 10-21-2021 at 12:41pm ? ?Addendum: Called mom again to complete EPDS, but no answer. Left voicemail for her to return nurse call. ? ?Duffy Rhody, RN 10-21-2021 at 2:20pm ?

## 2022-02-01 DIAGNOSIS — F32A Depression, unspecified: Secondary | ICD-10-CM | POA: Diagnosis not present

## 2022-03-08 DIAGNOSIS — Z0000 Encounter for general adult medical examination without abnormal findings: Secondary | ICD-10-CM | POA: Diagnosis not present

## 2022-03-08 DIAGNOSIS — Z6822 Body mass index (BMI) 22.0-22.9, adult: Secondary | ICD-10-CM | POA: Diagnosis not present

## 2022-03-16 DIAGNOSIS — F419 Anxiety disorder, unspecified: Secondary | ICD-10-CM | POA: Diagnosis not present

## 2022-03-16 DIAGNOSIS — F339 Major depressive disorder, recurrent, unspecified: Secondary | ICD-10-CM | POA: Diagnosis not present

## 2022-03-17 DIAGNOSIS — E041 Nontoxic single thyroid nodule: Secondary | ICD-10-CM | POA: Diagnosis not present

## 2022-03-22 DIAGNOSIS — Z0001 Encounter for general adult medical examination with abnormal findings: Secondary | ICD-10-CM | POA: Diagnosis not present

## 2022-03-22 DIAGNOSIS — E559 Vitamin D deficiency, unspecified: Secondary | ICD-10-CM | POA: Diagnosis not present

## 2022-03-22 DIAGNOSIS — Z131 Encounter for screening for diabetes mellitus: Secondary | ICD-10-CM | POA: Diagnosis not present

## 2022-03-22 DIAGNOSIS — Z13220 Encounter for screening for lipoid disorders: Secondary | ICD-10-CM | POA: Diagnosis not present

## 2022-03-22 LAB — TSH: TSH: 3.06 (ref 0.41–5.90)

## 2022-04-07 DIAGNOSIS — F329 Major depressive disorder, single episode, unspecified: Secondary | ICD-10-CM | POA: Diagnosis not present

## 2022-04-07 DIAGNOSIS — F419 Anxiety disorder, unspecified: Secondary | ICD-10-CM | POA: Diagnosis not present

## 2022-05-02 ENCOUNTER — Encounter: Payer: Self-pay | Admitting: Nurse Practitioner

## 2022-05-02 ENCOUNTER — Ambulatory Visit (INDEPENDENT_AMBULATORY_CARE_PROVIDER_SITE_OTHER): Payer: BC Managed Care – PPO | Admitting: Nurse Practitioner

## 2022-05-02 VITALS — BP 106/73 | HR 68 | Ht 66.0 in | Wt 144.0 lb

## 2022-05-02 DIAGNOSIS — E041 Nontoxic single thyroid nodule: Secondary | ICD-10-CM
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
## 2022-05-02 NOTE — Patient Instructions (Signed)
Thyroid Nodule  A thyroid nodule is an isolated growth of thyroid cells that forms a lump in the thyroid gland. The thyroid gland is a butterfly-shaped gland found in the lower front of the neck. It sends chemical messengers (hormones) through the blood to all parts of the body. These hormones are important in regulating body temperature and helping the body use energy. Thyroid nodules are common. Most are not cancerous (are benign). You may have one nodule or several nodules. There are different types of thyroid nodules. They include nodules that: Grow and fill with fluid (thyroid cysts). Produce too much thyroid hormone (hot nodules or hyperthyroid). Produce no thyroid hormone (cold nodules or hypothyroid). Form from cancer cells (thyroid cancers). What are the causes? In most cases, the cause of thyroid nodules is not known. What increases the risk? The following factors may make you more likely to develop thyroid nodules: Age. Thyroid nodules are more common in people who are older than 45 years. Female gender. A family history that includes: Thyroid nodules. Pheochromocytoma. Thyroid carcinoma. Hyperparathyroidism. Certain thyroid diseases, such as Hashimoto's thyroiditis. Lack of iodine in your diet. A history of head and neck radiation, such as from cancer treatments. Type 2 diabetes. What are the signs or symptoms? In many cases, there are no symptoms. If you have symptoms, they may include: A lump in your lower neck. Feeling pressure, fullness, or a tickle in your throat. Pain in your neck, jaw, or ear. Having trouble swallowing or breathing. Hot nodules may cause: Weight loss. Warm, flushed skin. Feeling hot. Feeling nervous. A rapid or irregular heartbeat. Cold nodules may cause: Weight gain. Dry skin. Hair loss, brittle hair, or both. Feeling cold. Fatigue. Thyroid cancer nodules may cause: Hard nodules that can be felt along the thyroid  gland. Hoarseness. Lumps in the tissue (lymph nodes) near your thyroid gland. How is this diagnosed? A thyroid nodule may be felt by your health care provider during a physical exam. This condition may also be diagnosed based on your symptoms. You may also have tests, including: Blood tests to check how well your thyroid is working. An ultrasound. This may be done to confirm the diagnosis. A biopsy. This involves taking a sample from the nodule and looking at it under a microscope. A thyroid scan. This test creates an image of the thyroid gland using a radioactive tracer. Imaging tests such as an MRI or CT scan. These may be done if: A nodule is large. A nodule is blocking your airway. Cancer is suspected. How is this treated? Treatment depends on the cause and size of your nodule or nodules. If a nodule is benign, treatment may not be necessary. Your health care provider may monitor the nodule to see if it goes away without treatment. If a nodule continues to grow, is cancerous, or does not go away, treatment may be needed. Treatment may include: Having a cystic nodule drained with a needle. Ablation therapy. In this treatment, alcohol is injected into the area of the nodule to destroy the cells. Ablation with heat may also be used. This is called thermal ablation. Radioactive iodine. In this treatment, radioactive iodine is given as a pill or liquid that you drink. This substance causes the thyroid nodule to shrink. Surgery to remove the nodule or nodules. Part or all of your thyroid gland may also need to be removed. Medicines to treat hyperthyroidism. Follow these instructions at home: Pay attention to any changes in your thyroid nodule or nodules. Take over-the-counter   and prescription medicines only as told by your health care provider. Keep all follow-up visits. This is important. Contact a health care provider if: You have trouble sleeping. You have muscle weakness. You have  significant weight loss without changing your eating habits. You feel nervous. You have trouble swallowing. You have increased swelling. You have a rapid or irregular heartbeat. Get help right away if: You have chest pain. You faint or lose consciousness. Your nodule makes it hard for you to breathe. These symptoms may be an emergency. Get help right away. Call 911. Do not wait to see if the symptoms will go away. Do not drive yourself to the hospital. Summary A thyroid nodule is an isolated growth of thyroid cells that forms a lump in your thyroid gland. Thyroid nodules are common. Most are not cancerous. Your health care provider may monitor the nodule to see if it goes away without treatment. If a nodule continues to grow, is cancerous, or does not go away, treatment may be needed. Treatment depends on the cause and size of your nodule or nodules. This information is not intended to replace advice given to you by your health care provider. Make sure you discuss any questions you have with your health care provider. Document Revised: 06/13/2021 Document Reviewed: 06/13/2021 Elsevier Patient Education  2023 [employer] Inc[employer]  

## 2022-05-05 DIAGNOSIS — F419 Anxiety disorder, unspecified: Secondary | ICD-10-CM | POA: Diagnosis not present

## 2022-05-05 DIAGNOSIS — F329 Major depressive disorder, single episode, unspecified: Secondary | ICD-10-CM | POA: Diagnosis not present

## 2022-05-05 DIAGNOSIS — F1010 Alcohol abuse, uncomplicated: Secondary | ICD-10-CM | POA: Diagnosis not present

## 2022-05-10 ENCOUNTER — Other Ambulatory Visit: Payer: Self-pay | Admitting: Nurse Practitioner

## 2022-05-10 ENCOUNTER — Ambulatory Visit
Admission: RE | Admit: 2022-05-10 | Discharge: 2022-05-10 | Disposition: A | Discharge status: Home / Self Care | Payer: Self-pay | Source: Ambulatory Visit | Attending: Nurse Practitioner | Admitting: Nurse Practitioner

## 2022-05-10 DIAGNOSIS — E041 Nontoxic single thyroid nodule: Secondary | ICD-10-CM

## 2022-05-16 NOTE — Progress Notes (Unsigned)
Mir, Paula Libra, MD  Donita Brooks D Approved for US guided FNA of left Thyroid nodule.   Mir

## 2022-05-25 ENCOUNTER — Ambulatory Visit (HOSPITAL_COMMUNITY)
Admission: RE | Admit: 2022-05-25 | Discharge: 2022-05-25 | Disposition: A | Discharge status: Home / Self Care | Payer: BC Managed Care – PPO | Source: Ambulatory Visit | Attending: Nurse Practitioner | Admitting: Nurse Practitioner

## 2022-05-25 ENCOUNTER — Encounter (HOSPITAL_COMMUNITY): Payer: Self-pay

## 2022-05-25 DIAGNOSIS — E041 Nontoxic single thyroid nodule: Secondary | ICD-10-CM | POA: Diagnosis not present

## 2022-05-25 MED ORDER — LIDOCAINE HCL (PF) 2 % IJ SOLN
INTRAMUSCULAR | Status: AC
  Administered 2022-05-25: 10 mL
  Filled 2022-05-25: qty 10

## 2022-05-25 NOTE — Progress Notes (Signed)
PT tolerated thyroid biopsy procedure well today. Labs and afirma obtained and sent for pathology. PT ambulatory at discharge with no acute distress noted and verbalized understanding of discharge instructions. 

## 2022-05-26 LAB — CYTOLOGY - NON PAP

## 2022-05-31 ENCOUNTER — Ambulatory Visit: Payer: BC Managed Care – PPO | Admitting: Nurse Practitioner

## 2022-06-02 DIAGNOSIS — F329 Major depressive disorder, single episode, unspecified: Secondary | ICD-10-CM | POA: Diagnosis not present

## 2022-06-02 DIAGNOSIS — F419 Anxiety disorder, unspecified: Secondary | ICD-10-CM | POA: Diagnosis not present

## 2022-06-02 DIAGNOSIS — F1010 Alcohol abuse, uncomplicated: Secondary | ICD-10-CM | POA: Diagnosis not present

## 2022-06-19 DIAGNOSIS — F1010 Alcohol abuse, uncomplicated: Secondary | ICD-10-CM | POA: Diagnosis not present

## 2022-06-19 DIAGNOSIS — F329 Major depressive disorder, single episode, unspecified: Secondary | ICD-10-CM | POA: Diagnosis not present

## 2022-06-19 DIAGNOSIS — F419 Anxiety disorder, unspecified: Secondary | ICD-10-CM | POA: Diagnosis not present

## 2022-07-24 DIAGNOSIS — F1010 Alcohol abuse, uncomplicated: Secondary | ICD-10-CM | POA: Diagnosis not present

## 2022-07-24 DIAGNOSIS — F419 Anxiety disorder, unspecified: Secondary | ICD-10-CM | POA: Diagnosis not present

## 2022-07-24 DIAGNOSIS — F329 Major depressive disorder, single episode, unspecified: Secondary | ICD-10-CM | POA: Diagnosis not present

## 2022-08-02 DIAGNOSIS — F329 Major depressive disorder, single episode, unspecified: Secondary | ICD-10-CM | POA: Diagnosis not present

## 2022-08-02 DIAGNOSIS — F1010 Alcohol abuse, uncomplicated: Secondary | ICD-10-CM | POA: Diagnosis not present

## 2022-08-02 DIAGNOSIS — F419 Anxiety disorder, unspecified: Secondary | ICD-10-CM | POA: Diagnosis not present

## 2022-08-25 DIAGNOSIS — F419 Anxiety disorder, unspecified: Secondary | ICD-10-CM | POA: Diagnosis not present

## 2022-08-25 DIAGNOSIS — F329 Major depressive disorder, single episode, unspecified: Secondary | ICD-10-CM | POA: Diagnosis not present

## 2022-08-25 DIAGNOSIS — F1010 Alcohol abuse, uncomplicated: Secondary | ICD-10-CM | POA: Diagnosis not present

## 2022-09-17 IMAGING — US US MFM OB DETAIL+14 WK
1 series · 12 of 28 positions shown · non-contrast
Comparison: none

[Series 1: us mfm ob detail+14 wk · 12 of 165 slices shown]
[im 7/165]
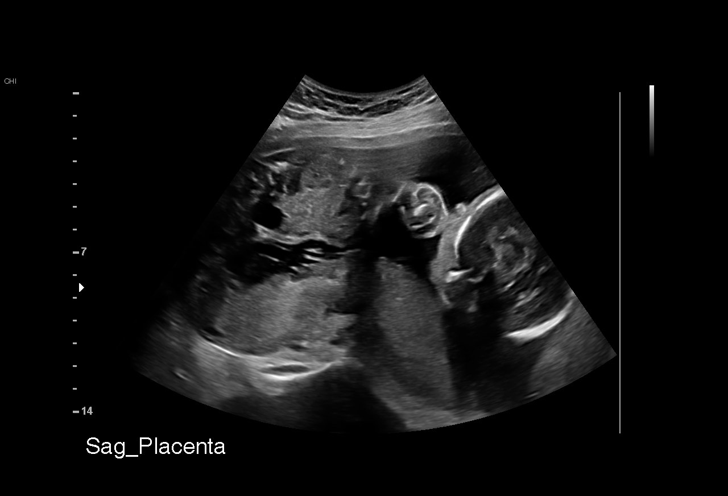
[im 19/165]
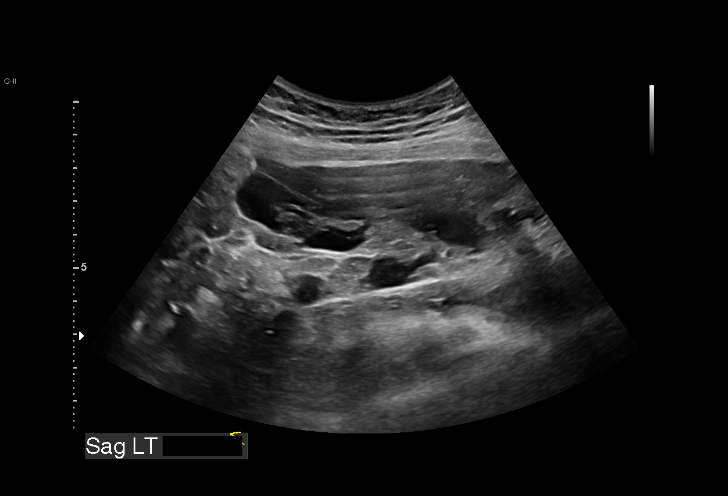
[im 31/165]
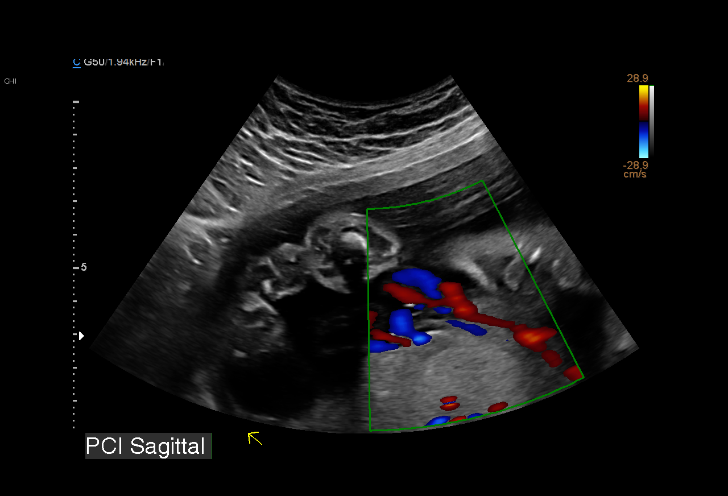
[im 49/165]
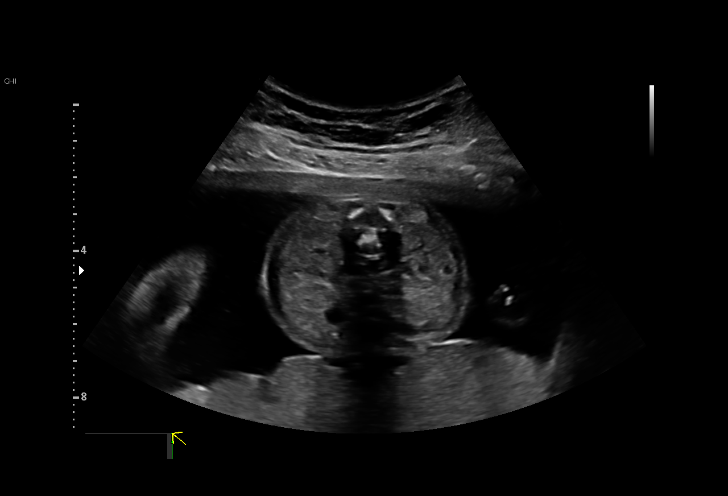
[im 61/165]
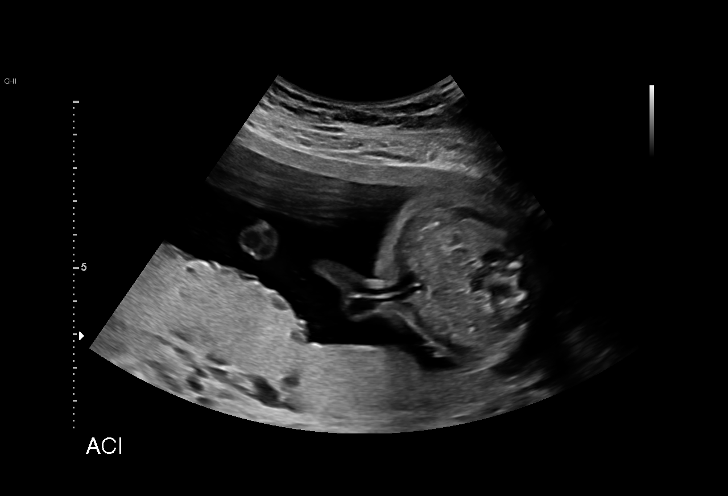
[im 73/165]
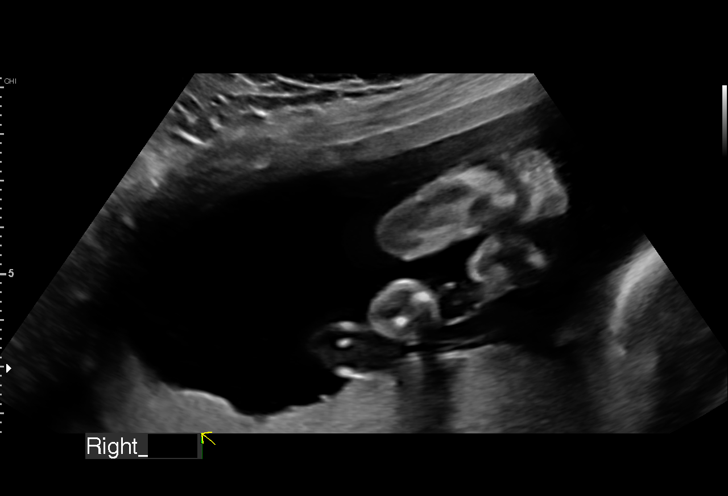
[im 92/165]
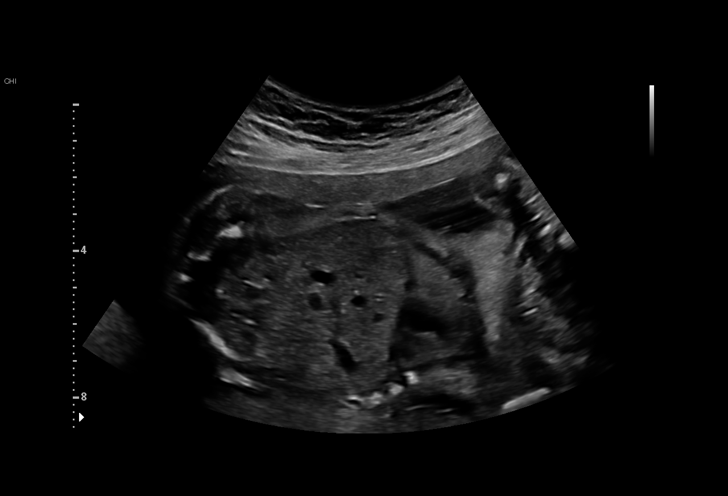
[im 104/165]
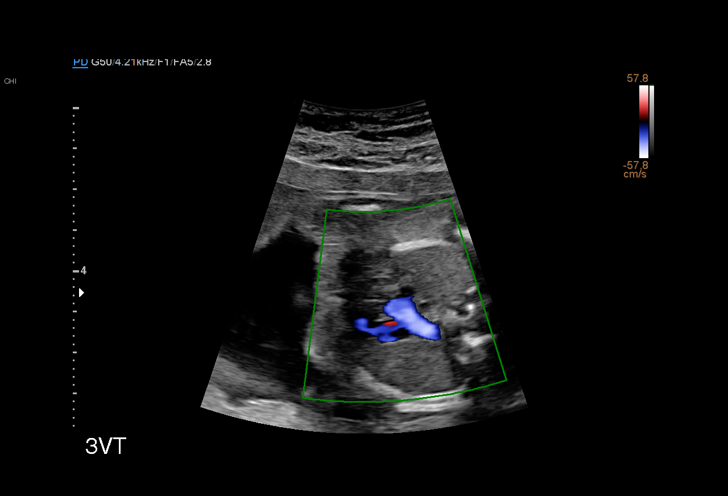
[im 116/165]
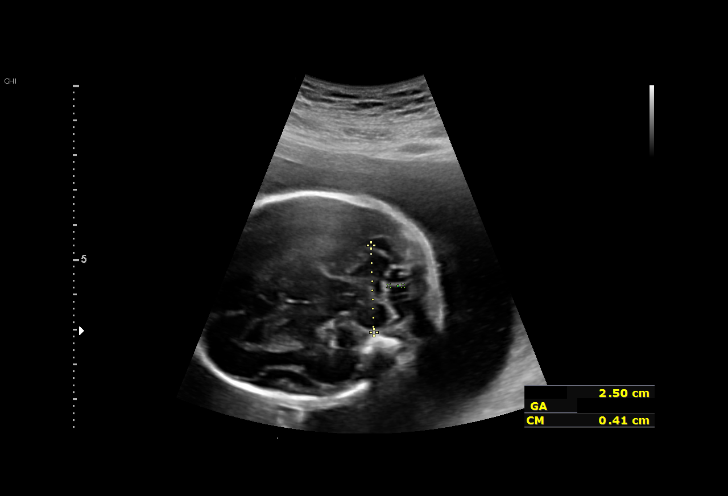
[im 134/165]
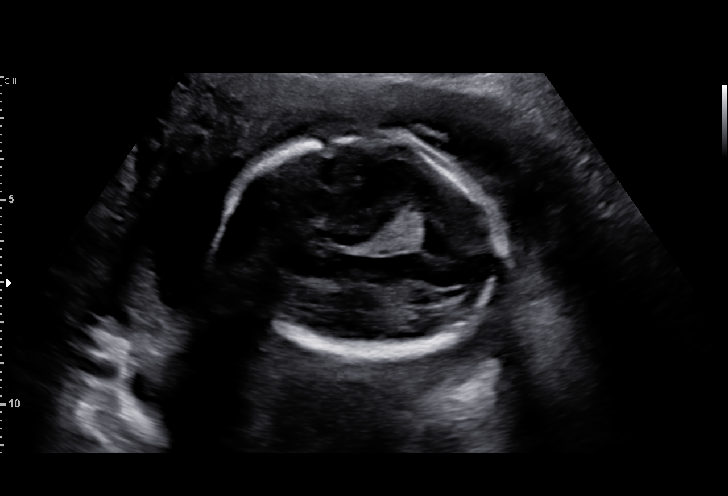
[im 146/165]
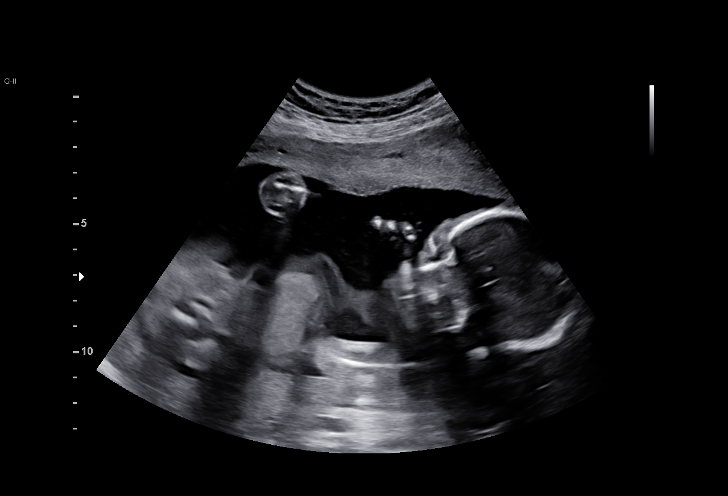
[im 158/165]
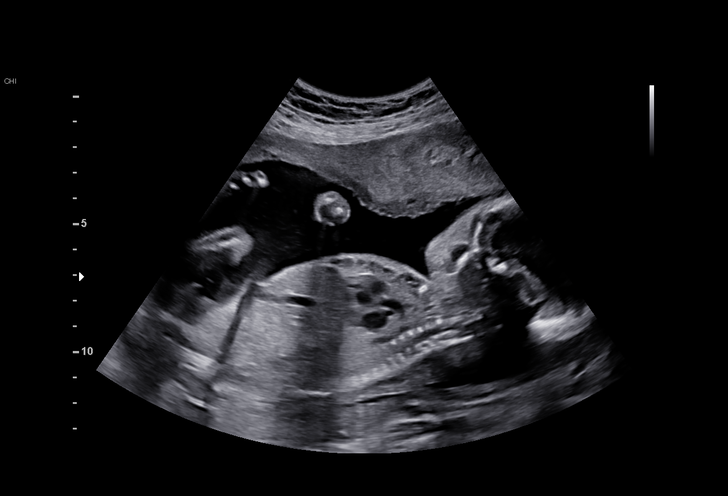

[12 of 28 positions shown; findings below may reference images not displayed]

[id number] [HOSPITAL]

Indications

 Abnormal fetal ultrasound
 Encounter for antenatal screening for
 malformations
 LR NIPS  male; Negative AFP
 Poor obstetric history: Previous
 preeclampsia / eclampsia/gestational HTN
 23 weeks gestation of pregnancy
Fetal Evaluation

 Num Of Fetuses:         1
 Fetal Heart Rate(bpm):  158
 Cardiac Activity:       Observed
 Presentation:           Cephalic
 Placenta:               Posterior
 P. Cord Insertion:      Visualized, central

 Amniotic Fluid
 AFI FV:      Within normal limits

                             Largest Pocket(cm)

Biometry

 BPD:      57.9  mm     G. Age:  23w 5d         56  %    CI:        77.23   %    70 - 86
                                                         FL/HC:      19.1   %    19.2 -
 HC:      208.6  mm     G. Age:  23w 0d         18  %    HC/AC:      1.10        1.05 -
 AC:      188.9  mm     G. Age:  23w 5d         48  %    FL/BPD:     68.7   %    71 - 87
 FL:       39.8  mm     G. Age:  22w 6d         20  %    FL/AC:      21.1   %    20 - 24
 HUM:      36.4  mm     G. Age:  22w 5d         26  %
 CER:        25  mm     G. Age:  22w 6d         53  %

 LV:        7.1  mm
 CM:        4.1  mm

 Est. FW:     580  gm      1 lb 4 oz     35  %
OB History

 Gravidity:    2         Term:   1
 Living:       1
Gestational Age

 LMP:           23w 3d        Date:  01/21/21                 EDD:   10/28/21
 Clinical EDD:  23w 3d                                        EDD:   10/28/21
 U/S Today:     23w 2d                                        EDD:   10/29/21
 Best:          23w 3d     Det. By:  LMP  (01/21/21)          EDD:   10/28/21
Anatomy

 Cranium:               Appears normal         LVOT:                   Not well visualized
 Cavum:                 Appears normal         Aortic Arch:            Not well visualized
 Ventricles:            Appears normal         Ductal Arch:            Not well visualized
 Choroid Plexus:        Appears normal         Diaphragm:              Appears normal
 Cerebellum:            Appears normal         Stomach:                Appears normal, left
                                                                       sided
 Posterior Fossa:       Appears normal         Abdomen:                Appears normal
 Nuchal Fold:           Not applicable (>20    Abdominal Wall:         Appears nml (cord
                        wks GA)                                        insert, abd wall)
 Face:                  Not well visualized    Cord Vessels:           Appears normal (3
                                                                       vessel cord)
 Lips:                  Not well visualized    Kidneys:                Appear normal
 Palate:                Not well visualized    Bladder:                Appears normal
 Thoracic:              Appears normal         Spine:                  Appears normal
 Heart:                 Appears normal         Upper Extremities:      Appears normal;Lt
                        (4CH, axis, and                                R/U not vis.
                        situs)
 RVOT:                  Not well visualized    Lower Extremities:      Appears normal

 Other:  Fetus appears to be a male. VC, 3VV and 3VTV visualized. Left
         radius/ulna not visualized due to fetal position.  Technically difficult
         due to fetal position.
Cervix Uterus Adnexa

 Cervix
 Length:           3.24  cm.
 Normal appearance by transabdominal scan.

 Uterus
 No abnormality visualized.

 Right Ovary
 Within normal limits.
 Left Ovary
 Within normal limits.

 Cul De Sac
 No free fluid seen.

 Adnexa
 No abnormality visualized.
Comments

 This patient was seen for a detailed fetal anatomy scan.
 Bilateral choroid plexus cysts and possible echogenic bowel
 were noted during an ultrasound performed in your office last
 month.
 She denies any significant past medical history and denies
 any problems in her current pregnancy.
 She had a cell free DNA test earlier in her pregnancy which
 indicated a low risk for trisomy 21, 18, and 13. A male fetus is
 predicted.
 She was informed that the fetal growth and amniotic fluid
 level were appropriate for her gestational age.
 There were no obvious fetal anomalies noted on today's
 ultrasound exam.  The previously noted bilateral choroid
 plexus cysts have resolved.  There were no signs of
 echogenic bowel noted today.  The views of the fetal anatomy
 were limited today due to the fetal position.
 The patient was informed that anomalies may be missed due
 to technical limitations. If the fetus is in a suboptimal position
 or maternal habitus is increased, visualization of the fetus in
 the maternal uterus may be impaired.
 Due to the previously noted echogenic bowel and choroid
 plexus cyst, the patient was offered and declined an
 amniocentesis for definitive diagnosis of fetal aneuploidy.
 She is comfortable with her negative cell free DNA test.  She
 declined to pursue any further testing related to the
 echogenic bowel.
 Due to the potential association of fetal growth restriction with
 echogenic bowel, she should continue to be followed with
 growth ultrasounds throughout her pregnancy.
 A follow up exam was scheduled in 4 weeks to complete the
 views of the fetal anatomy and to assess the fetal growth.

## 2022-11-06 DIAGNOSIS — F419 Anxiety disorder, unspecified: Secondary | ICD-10-CM | POA: Diagnosis not present

## 2022-11-06 DIAGNOSIS — F329 Major depressive disorder, single episode, unspecified: Secondary | ICD-10-CM | POA: Diagnosis not present

## 2022-11-06 DIAGNOSIS — F1010 Alcohol abuse, uncomplicated: Secondary | ICD-10-CM | POA: Diagnosis not present

## 2023-01-01 DIAGNOSIS — F1010 Alcohol abuse, uncomplicated: Secondary | ICD-10-CM | POA: Diagnosis not present

## 2023-01-01 DIAGNOSIS — F329 Major depressive disorder, single episode, unspecified: Secondary | ICD-10-CM | POA: Diagnosis not present

## 2023-01-01 DIAGNOSIS — F419 Anxiety disorder, unspecified: Secondary | ICD-10-CM | POA: Diagnosis not present

## 2023-01-01 DIAGNOSIS — Z79899 Other long term (current) drug therapy: Secondary | ICD-10-CM | POA: Diagnosis not present

## 2023-02-13 DIAGNOSIS — Z79899 Other long term (current) drug therapy: Secondary | ICD-10-CM | POA: Diagnosis not present

## 2023-02-13 DIAGNOSIS — F329 Major depressive disorder, single episode, unspecified: Secondary | ICD-10-CM | POA: Diagnosis not present

## 2023-02-13 DIAGNOSIS — F419 Anxiety disorder, unspecified: Secondary | ICD-10-CM | POA: Diagnosis not present

## 2023-02-13 DIAGNOSIS — F1010 Alcohol abuse, uncomplicated: Secondary | ICD-10-CM | POA: Diagnosis not present

## 2023-03-05 DIAGNOSIS — F329 Major depressive disorder, single episode, unspecified: Secondary | ICD-10-CM | POA: Diagnosis not present

## 2023-03-05 DIAGNOSIS — F419 Anxiety disorder, unspecified: Secondary | ICD-10-CM | POA: Diagnosis not present

## 2023-03-05 DIAGNOSIS — F1010 Alcohol abuse, uncomplicated: Secondary | ICD-10-CM | POA: Diagnosis not present

## 2023-06-07 DIAGNOSIS — N930 Postcoital and contact bleeding: Secondary | ICD-10-CM | POA: Diagnosis not present

## 2023-06-07 DIAGNOSIS — N760 Acute vaginitis: Secondary | ICD-10-CM | POA: Diagnosis not present

## 2023-07-10 DIAGNOSIS — Z01411 Encounter for gynecological examination (general) (routine) with abnormal findings: Secondary | ICD-10-CM | POA: Diagnosis not present

## 2023-07-10 DIAGNOSIS — N930 Postcoital and contact bleeding: Secondary | ICD-10-CM | POA: Diagnosis not present

## 2023-07-10 DIAGNOSIS — Z1331 Encounter for screening for depression: Secondary | ICD-10-CM | POA: Diagnosis not present

## 2023-08-03 DIAGNOSIS — R3915 Urgency of urination: Secondary | ICD-10-CM | POA: Diagnosis not present

## 2023-08-03 DIAGNOSIS — N930 Postcoital and contact bleeding: Secondary | ICD-10-CM | POA: Diagnosis not present

## 2023-08-03 DIAGNOSIS — Z113 Encounter for screening for infections with a predominantly sexual mode of transmission: Secondary | ICD-10-CM | POA: Diagnosis not present

## 2023-11-06 DIAGNOSIS — F1010 Alcohol abuse, uncomplicated: Secondary | ICD-10-CM | POA: Diagnosis not present

## 2023-11-06 DIAGNOSIS — Z6823 Body mass index (BMI) 23.0-23.9, adult: Secondary | ICD-10-CM | POA: Diagnosis not present

## 2023-11-06 DIAGNOSIS — R109 Unspecified abdominal pain: Secondary | ICD-10-CM | POA: Diagnosis not present

## 2023-11-06 DIAGNOSIS — R112 Nausea with vomiting, unspecified: Secondary | ICD-10-CM | POA: Diagnosis not present

## 2023-11-06 DIAGNOSIS — F419 Anxiety disorder, unspecified: Secondary | ICD-10-CM | POA: Diagnosis not present

## 2023-11-09 DIAGNOSIS — F1010 Alcohol abuse, uncomplicated: Secondary | ICD-10-CM | POA: Diagnosis not present

## 2023-11-09 DIAGNOSIS — R112 Nausea with vomiting, unspecified: Secondary | ICD-10-CM | POA: Diagnosis not present

## 2023-11-09 DIAGNOSIS — R109 Unspecified abdominal pain: Secondary | ICD-10-CM | POA: Diagnosis not present

## 2023-11-19 DIAGNOSIS — Z131 Encounter for screening for diabetes mellitus: Secondary | ICD-10-CM | POA: Diagnosis not present

## 2023-11-19 DIAGNOSIS — Z0001 Encounter for general adult medical examination with abnormal findings: Secondary | ICD-10-CM | POA: Diagnosis not present

## 2023-11-19 DIAGNOSIS — Z13220 Encounter for screening for lipoid disorders: Secondary | ICD-10-CM | POA: Diagnosis not present

## 2023-11-19 DIAGNOSIS — E559 Vitamin D deficiency, unspecified: Secondary | ICD-10-CM | POA: Diagnosis not present

## 2023-12-07 DIAGNOSIS — F1021 Alcohol dependence, in remission: Secondary | ICD-10-CM | POA: Diagnosis not present

## 2023-12-07 DIAGNOSIS — F419 Anxiety disorder, unspecified: Secondary | ICD-10-CM | POA: Diagnosis not present

## 2023-12-07 DIAGNOSIS — Z6823 Body mass index (BMI) 23.0-23.9, adult: Secondary | ICD-10-CM | POA: Diagnosis not present

## 2024-05-22 DIAGNOSIS — F331 Major depressive disorder, recurrent, moderate: Secondary | ICD-10-CM | POA: Diagnosis not present

## 2024-06-05 DIAGNOSIS — F331 Major depressive disorder, recurrent, moderate: Secondary | ICD-10-CM | POA: Diagnosis not present

## 2024-06-19 DIAGNOSIS — F331 Major depressive disorder, recurrent, moderate: Secondary | ICD-10-CM | POA: Diagnosis not present

## 2024-06-26 DIAGNOSIS — F331 Major depressive disorder, recurrent, moderate: Secondary | ICD-10-CM | POA: Diagnosis not present

## 2024-07-17 DIAGNOSIS — F331 Major depressive disorder, recurrent, moderate: Secondary | ICD-10-CM | POA: Diagnosis not present

## 2024-07-24 DIAGNOSIS — F331 Major depressive disorder, recurrent, moderate: Secondary | ICD-10-CM | POA: Diagnosis not present

## 2024-07-31 DIAGNOSIS — F331 Major depressive disorder, recurrent, moderate: Secondary | ICD-10-CM | POA: Diagnosis not present
